# Patient Record
Sex: Female | Born: 1990 | Race: Black or African American | Hispanic: No | Marital: Single | Smoking: Never smoker
Health system: Southern US, Community
[De-identification: ages and names within clinical notes are randomized; demographics above are authoritative.]

## PROBLEM LIST (undated history)

## (undated) DIAGNOSIS — R05 Cough: Secondary | ICD-10-CM

## (undated) DIAGNOSIS — M25511 Pain in right shoulder: Secondary | ICD-10-CM

## (undated) DIAGNOSIS — E611 Iron deficiency: Secondary | ICD-10-CM

## (undated) DIAGNOSIS — I97190 Other postprocedural cardiac functional disturbances following cardiac surgery: Secondary | ICD-10-CM

## (undated) DIAGNOSIS — J111 Influenza due to unidentified influenza virus with other respiratory manifestations: Secondary | ICD-10-CM

## (undated) DIAGNOSIS — R7303 Prediabetes: Secondary | ICD-10-CM

## (undated) DIAGNOSIS — J159 Unspecified bacterial pneumonia: Secondary | ICD-10-CM

## (undated) DIAGNOSIS — R42 Dizziness and giddiness: Secondary | ICD-10-CM

## (undated) DIAGNOSIS — Z8739 Personal history of other diseases of the musculoskeletal system and connective tissue: Secondary | ICD-10-CM

## (undated) DIAGNOSIS — M25531 Pain in right wrist: Secondary | ICD-10-CM

## (undated) DIAGNOSIS — M545 Low back pain, unspecified: Secondary | ICD-10-CM

## (undated) DIAGNOSIS — N915 Oligomenorrhea, unspecified: Secondary | ICD-10-CM

## (undated) DIAGNOSIS — G5600 Carpal tunnel syndrome, unspecified upper limb: Secondary | ICD-10-CM

## (undated) DIAGNOSIS — R9431 Abnormal electrocardiogram [ECG] [EKG]: Secondary | ICD-10-CM

## (undated) DIAGNOSIS — R5383 Other fatigue: Secondary | ICD-10-CM

## (undated) DIAGNOSIS — E559 Vitamin D deficiency, unspecified: Secondary | ICD-10-CM

## (undated) DIAGNOSIS — M62830 Muscle spasm of back: Secondary | ICD-10-CM

## (undated) DIAGNOSIS — R002 Palpitations: Secondary | ICD-10-CM

## (undated) DIAGNOSIS — J22 Unspecified acute lower respiratory infection: Secondary | ICD-10-CM

## (undated) DIAGNOSIS — I442 Atrioventricular block, complete: Secondary | ICD-10-CM

## (undated) DIAGNOSIS — M129 Arthropathy, unspecified: Secondary | ICD-10-CM

## (undated) DIAGNOSIS — E7800 Pure hypercholesterolemia, unspecified: Secondary | ICD-10-CM

## (undated) DIAGNOSIS — R059 Cough, unspecified: Secondary | ICD-10-CM

## (undated) DIAGNOSIS — R110 Nausea: Secondary | ICD-10-CM

## (undated) DIAGNOSIS — H8110 Benign paroxysmal vertigo, unspecified ear: Secondary | ICD-10-CM

## (undated) DIAGNOSIS — R0602 Shortness of breath: Secondary | ICD-10-CM

## (undated) DIAGNOSIS — M546 Pain in thoracic spine: Secondary | ICD-10-CM

## (undated) DIAGNOSIS — M791 Myalgia, unspecified site: Secondary | ICD-10-CM

## (undated) DIAGNOSIS — N946 Dysmenorrhea, unspecified: Secondary | ICD-10-CM

## (undated) DIAGNOSIS — M25512 Pain in left shoulder: Secondary | ICD-10-CM

## (undated) HISTORY — DX: Abnormal electrocardiogram (ECG) (EKG): R94.31

## (undated) HISTORY — PX: ABLATION: SHX5711

## (undated) HISTORY — DX: Muscle spasm of back: M62.830

## (undated) HISTORY — DX: Pain in right shoulder: M25.511

## (undated) HISTORY — DX: Palpitations: R00.2

## (undated) HISTORY — DX: Oligomenorrhea, unspecified: N91.5

## (undated) HISTORY — DX: Carpal tunnel syndrome, unspecified upper limb: G56.00

## (undated) HISTORY — DX: Influenza due to unidentified influenza virus with other respiratory manifestations: J11.1

## (undated) HISTORY — DX: Nausea: R11.0

## (undated) HISTORY — DX: Prediabetes: R73.03

## (undated) HISTORY — DX: Arthropathy, unspecified: M12.9

## (undated) HISTORY — DX: Iron deficiency: E61.1

## (undated) HISTORY — DX: Cough, unspecified: R05.9

## (undated) HISTORY — DX: Shortness of breath: R06.02

## (undated) HISTORY — PX: LOOP RECORDER INSERTION: EP1214

## (undated) HISTORY — DX: Low back pain, unspecified: M54.50

## (undated) HISTORY — DX: Other fatigue: R53.83

## (undated) HISTORY — DX: Unspecified acute lower respiratory infection: J22

## (undated) HISTORY — DX: Pain in thoracic spine: M54.6

## (undated) HISTORY — DX: Hypomagnesemia: E83.42

## (undated) HISTORY — DX: Dizziness and giddiness: R42

## (undated) HISTORY — DX: Pure hypercholesterolemia, unspecified: E78.00

## (undated) HISTORY — DX: Pain in right wrist: M25.531

## (undated) HISTORY — DX: Myalgia, unspecified site: M79.10

## (undated) HISTORY — DX: Vitamin D deficiency, unspecified: E55.9

## (undated) HISTORY — DX: Dysmenorrhea, unspecified: N94.6

## (undated) HISTORY — DX: Pain in left shoulder: M25.512

## (undated) HISTORY — DX: Benign paroxysmal vertigo, unspecified ear: H81.10

## (undated) HISTORY — DX: Unspecified bacterial pneumonia: J15.9

---
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
## 2013-07-03 DIAGNOSIS — G90A Postural orthostatic tachycardia syndrome (POTS): Secondary | ICD-10-CM | POA: Insufficient documentation

## 2015-02-03 DIAGNOSIS — I471 Supraventricular tachycardia, unspecified: Secondary | ICD-10-CM | POA: Insufficient documentation

## 2017-01-20 DIAGNOSIS — M25512 Pain in left shoulder: Secondary | ICD-10-CM | POA: Insufficient documentation

## 2017-01-20 DIAGNOSIS — G8929 Other chronic pain: Secondary | ICD-10-CM | POA: Insufficient documentation

## 2017-09-12 DIAGNOSIS — M249 Joint derangement, unspecified: Secondary | ICD-10-CM | POA: Insufficient documentation

## 2017-09-12 DIAGNOSIS — Z95818 Presence of other cardiac implants and grafts: Secondary | ICD-10-CM | POA: Insufficient documentation

## 2017-10-27 DIAGNOSIS — I10 Essential (primary) hypertension: Secondary | ICD-10-CM | POA: Insufficient documentation

## 2017-10-29 DIAGNOSIS — Z8249 Family history of ischemic heart disease and other diseases of the circulatory system: Secondary | ICD-10-CM | POA: Insufficient documentation

## 2017-10-29 DIAGNOSIS — J4520 Mild intermittent asthma, uncomplicated: Secondary | ICD-10-CM | POA: Insufficient documentation

## 2017-10-29 DIAGNOSIS — Z832 Family history of diseases of the blood and blood-forming organs and certain disorders involving the immune mechanism: Secondary | ICD-10-CM | POA: Insufficient documentation

## 2018-03-27 ENCOUNTER — Emergency Department (HOSPITAL_COMMUNITY)
Admission: EM | Admit: 2018-03-27 | Discharge: 2018-03-27 | Disposition: A | Discharge status: Home / Self Care | Payer: Self-pay | Attending: Emergency Medicine | Admitting: Emergency Medicine

## 2018-03-27 ENCOUNTER — Encounter (HOSPITAL_COMMUNITY): Payer: Self-pay

## 2018-03-27 ENCOUNTER — Emergency Department (HOSPITAL_COMMUNITY): Payer: Self-pay

## 2018-03-27 DIAGNOSIS — R05 Cough: Secondary | ICD-10-CM | POA: Insufficient documentation

## 2018-03-27 DIAGNOSIS — R059 Cough, unspecified: Secondary | ICD-10-CM

## 2018-03-27 HISTORY — DX: Other postprocedural cardiac functional disturbances following cardiac surgery: I97.190

## 2018-03-27 HISTORY — DX: Atrioventricular block, complete: I44.2

## 2018-03-27 LAB — CBC WITH DIFFERENTIAL/PLATELET
Abs Immature Granulocytes: 0.02 10*3/uL (ref 0.00–0.07)
Basophils Absolute: 0 10*3/uL (ref 0.0–0.1)
Basophils Relative: 0 %
Eosinophils Absolute: 0 10*3/uL (ref 0.0–0.5)
Eosinophils Relative: 0 %
HCT: 43.4 % (ref 36.0–46.0)
Hemoglobin: 14.4 g/dL (ref 12.0–15.0)
Immature Granulocytes: 0 %
Lymphocytes Relative: 35 %
Lymphs Abs: 2.9 10*3/uL (ref 0.7–4.0)
MCH: 30.4 pg (ref 26.0–34.0)
MCHC: 33.2 g/dL (ref 30.0–36.0)
MCV: 91.8 fL (ref 80.0–100.0)
Monocytes Absolute: 0.7 10*3/uL (ref 0.1–1.0)
Monocytes Relative: 8 %
Neutro Abs: 4.7 10*3/uL (ref 1.7–7.7)
Neutrophils Relative %: 57 %
Platelets: 199 10*3/uL (ref 150–400)
RBC: 4.73 MIL/uL (ref 3.87–5.11)
RDW: 13.2 % (ref 11.5–15.5)
WBC: 8.4 10*3/uL (ref 4.0–10.5)
nRBC: 0 % (ref 0.0–0.2)

## 2018-03-27 LAB — D-DIMER, QUANTITATIVE (NOT AT ARMC): D DIMER QUANT: 1.05 ug{FEU}/mL — AB (ref 0.00–0.50)

## 2018-03-27 LAB — BASIC METABOLIC PANEL
Anion gap: 13 (ref 5–15)
BUN: 7 mg/dL (ref 6–20)
CO2: 24 mmol/L (ref 22–32)
CREATININE: 0.99 mg/dL (ref 0.44–1.00)
Calcium: 8.9 mg/dL (ref 8.9–10.3)
Chloride: 105 mmol/L (ref 98–111)
GFR calc Af Amer: >60 mL/min (ref >60)
GFR calc non Af Amer: >60 mL/min (ref >60)
GLUCOSE: 94 mg/dL (ref 70–99)
Potassium: 3 mmol/L — ABNORMAL LOW (ref 3.5–5.1)
Sodium: 142 mmol/L (ref 135–145)

## 2018-03-27 LAB — I-STAT TROPONIN, ED: Troponin i, poc: 0 ng/mL (ref 0.00–0.08)

## 2018-03-27 MED ORDER — IOPAMIDOL (ISOVUE-370) INJECTION 76%
INTRAVENOUS | Status: AC
  Filled 2018-03-27: qty 100

## 2018-03-27 MED ORDER — IOPAMIDOL (ISOVUE-370) INJECTION 76%
100.0000 mL | Freq: Once | INTRAVENOUS | Status: AC | PRN
  Administered 2018-03-27: 63 mL via INTRAVENOUS

## 2018-03-27 MED ORDER — KETOROLAC TROMETHAMINE 15 MG/ML IJ SOLN
15.0000 mg | Freq: Once | INTRAMUSCULAR | Status: AC
  Administered 2018-03-27: 15 mg via INTRAVENOUS
  Filled 2018-03-27: qty 1

## 2018-03-27 MED ORDER — POTASSIUM CHLORIDE CRYS ER 20 MEQ PO TBCR
40.0000 meq | EXTENDED_RELEASE_TABLET | Freq: Once | ORAL | Status: AC
  Administered 2018-03-27: 40 meq via ORAL
  Filled 2018-03-27: qty 2

## 2018-03-27 MED ORDER — SODIUM CHLORIDE 0.9 % IV BOLUS
1000.0000 mL | Freq: Once | INTRAVENOUS | Status: AC
  Administered 2018-03-27: 1000 mL via INTRAVENOUS

## 2018-03-27 NOTE — Discharge Instructions (Addendum)
For any problem.  Follow-up with your regular care provider as instructed.  Continue to use currently prescribed antibiotics.  Continue to use currently prescribed Tamiflu.

## 2018-03-27 NOTE — ED Provider Notes (Signed)
[facility] EMERGENCY [facility] Provider Note   CSN: [id number] Arrival date & time: 03/27/18  1137     History   Chief Complaint Chief Complaint  Patient presents with  . Cough    HPI Marilyn Rhodes is a 28 y.o. female.  28 year old female with prior medical history as detailed below presents for evaluation of cough, chest wall pain, and malaise and fatigue.  She reports that she was diagnosed with both the flu and a pneumonia last week.  She is currently on both Tamiflu and antibiotics for treatment.  She complains of continued chest wall discomfort.  She complains of continued cough.  She denies shortness of breath.  She denies recent fever.  She denies nausea, vomiting, abdominal pain, or other acute complaint.  The history is provided by the patient and medical records.  Cough  Cough characteristics:  Dry Sputum characteristics:  Nondescript Severity:  Mild Onset quality:  Gradual Timing:  Constant Progression:  Waxing and waning Chronicity:  New Worsened by:  Nothing Ineffective treatments:  Cough suppressants Associated symptoms: chest pain     Past Medical History:  Diagnosis Date  . 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   History reviewed. No pertinent surgical history.   OB History   No obstetric history on file.      Home Medications    Prior to Admission medications   Not on File    Family History No family history on file.  Social History Social History   Tobacco Use  . Smoking status: Never Smoker  . Smokeless tobacco: Never Used  Substance Use Topics  . Alcohol use: Not Currently  . Drug use: Not Currently     Allergies   Albuterol and Penicillins   Review of Systems Review of Systems  Respiratory: Positive for cough.   Cardiovascular: Positive for chest pain.  All other systems reviewed and are  negative.    Physical Exam Updated Vital Signs BP 123/88   Pulse 78   Temp 98.6 F (37 C) (Oral)   Resp 16   LMP 03/11/2018 (Exact Date)   SpO2 100%   Physical Exam Vitals signs and nursing note reviewed.  Constitutional:      General: She is not in acute distress.    Appearance: She is well-developed.  HENT:     Head: Normocephalic and atraumatic.  Eyes:     Conjunctiva/sclera: Conjunctivae normal.     Pupils: Pupils are equal, round, and reactive to light.  Neck:     Musculoskeletal: Normal range of motion and neck supple.  Cardiovascular:     Rate and Rhythm: Normal rate and regular rhythm.     Heart sounds: Normal heart sounds.  Pulmonary:     Effort: Pulmonary effort is normal. No respiratory distress.     Breath sounds: Normal breath sounds.  Abdominal:     General: There is no distension.     Palpations: Abdomen is soft.     Tenderness: There is no abdominal tenderness.  Musculoskeletal: Normal range of motion.        General: No deformity.  Skin:    General: Skin is warm and dry.  Neurological:     Mental Status: She is alert and oriented to person, place, and time.      ED Treatments / Results  Labs (all labs ordered are listed, but only abnormal results  are displayed) Labs Reviewed  D-DIMER, QUANTITATIVE (NOT AT [facility]) - Abnormal; Notable for the following components:      Result Value   D-Dimer, Quant 1.05 (*)    All other components within normal limits  BASIC METABOLIC PANEL - Abnormal; Notable for the following components:   Potassium 3.0 (*)    All other components within normal limits  CBC WITH DIFFERENTIAL/PLATELET  I-STAT TROPONIN, ED    EKG EKG Interpretation  Date/Time:  Tuesday March 27 2018 11:40:28 EST Ventricular Rate:  101 PR Interval:  120 QRS Duration: 86 QT Interval:  348 QTC Calculation: 451 R Axis:   104 Text Interpretation:  Sinus tachycardia with Premature atrial complexes Biatrial enlargement Rightward axis  Pulmonary disease pattern T wave abnormality, consider inferior ischemia T wave abnormality, consider anterolateral ischemia Abnormal ECG Confirmed by Kristine Royal [phone]) on 03/27/2018 1:13:18 PM   Radiology Dg Chest 2 View  Result Date: 03/27/2018 CLINICAL DATA:  Cough, weakness EXAM: CHEST - 2 VIEW COMPARISON:  None. FINDINGS: The heart size and mediastinal contours are within normal limits. Implantable loop recorder both lungs are clear. The visualized skeletal structures are unremarkable. IMPRESSION: No acute abnormality of the lungs.  No focal airspace opacity. Electronically Signed   By: Lauralyn Primes M.D.   On: 03/27/2018 14:00   Ct Angio Chest Pe W And/or Wo Contrast  Result Date: 03/27/2018 CLINICAL DATA:  Cough, pneumonia, fluid, chest wall pain, rule out pulmonary embolism EXAM: CT ANGIOGRAPHY CHEST WITH CONTRAST TECHNIQUE: Multidetector CT imaging of the chest was performed using the standard protocol during bolus administration of intravenous contrast. Multiplanar CT image reconstructions and MIPs were obtained to evaluate the vascular anatomy. CONTRAST:  37mL ISOVUE-370 IOPAMIDOL (ISOVUE-370) INJECTION 76% COMPARISON:  None. FINDINGS: Cardiovascular: Satisfactory opacification of the pulmonary arteries to the segmental level. No evidence of pulmonary embolism. Normal heart size. No pericardial effusion. Mediastinum/Nodes: No enlarged mediastinal, hilar, or axillary lymph nodes. Thyroid gland, trachea, and esophagus demonstrate no significant findings. Lungs/Pleura: There are bilateral scattered small ground-glass opacities, most prominent in the right upper lobe with a more conspicuous and focal opacity of the medial right pulmonary apex (series 6, image 35). No pleural effusion or pneumothorax. Upper Abdomen: No acute abnormality. Musculoskeletal: Implantable loop recorder. No acute or significant osseous findings. Review of the MIP images confirms the above findings. IMPRESSION: 1.   Negative examination pulmonary embolism. 2. There are bilateral scattered small ground-glass opacities, most prominent in the right upper lobe with a more conspicuous and focal opacity of the medial right pulmonary apex (series 6, image 35). Findings are generally consistent with infection. Electronically Signed   By: Lauralyn Primes M.D.   On: 03/27/2018 16:35    Procedures Procedures (including critical care time)  Medications Ordered in ED Medications  iopamidol (ISOVUE-370) 76 % injection (has no administration in time range)  sodium chloride 0.9 % bolus 1,000 mL (0 mLs Intravenous Stopped 03/27/18 1624)  ketorolac (TORADOL) 15 MG/ML injection 15 mg (15 mg Intravenous Given 03/27/18 1342)  potassium chloride SA (K-DUR,KLOR-CON) CR tablet 40 mEq (40 mEq Oral Given 03/27/18 1637)  iopamidol (ISOVUE-370) 76 % injection 100 mL (63 mLs Intravenous Contrast Given 03/27/18 1624)     Initial Impression / Assessment and Plan / ED Course  I have reviewed the triage vital signs and the nursing notes.  Pertinent labs & imaging results that were available during my care of the patient were reviewed by me and considered in my medical decision making (see chart  for details).     MDM  Screen complete  Patient is presenting for evaluation of cough, chest wall pain, malaise, and fatigue.  Patient is currently finishing a course of antibiotics for recently diagnosed pneumonia.  Work-up today does demonstrate evidence of likely resolving pneumonia.  No evidence of PE or ACS found on work-up.  EKG is without evidence of acute ischemia.  Troponin is negative.  CTA did not demonstrate PE.  Other screening labs are without significant abnormality.  Patient does feel improved following treatment in the ED.  She understands need for close follow-up.  Strict return precautions given and understood.  Final Clinical Impressions(s) / ED Diagnoses   Final diagnoses:  Cough    ED Discharge Orders    None        Wynetta Fines, MD 03/27/18 [phone]

## 2018-03-27 NOTE — ED Triage Notes (Signed)
Pt presents for evaluation of cough with diagnosis of pneumonia last week. Non productive cough. Chest wall pain with coughing. Pt is on antibiotics.

## 2018-06-26 DIAGNOSIS — M79641 Pain in right hand: Secondary | ICD-10-CM | POA: Insufficient documentation

## 2018-06-27 DIAGNOSIS — R202 Paresthesia of skin: Secondary | ICD-10-CM | POA: Insufficient documentation

## 2018-07-17 ENCOUNTER — Telehealth: Payer: Self-pay | Admitting: *Deleted

## 2018-07-17 NOTE — Telephone Encounter (Signed)
REFERRAL SENT TO SCHEDULING AND NOTES ON FILE FROM [facility] [phone].

## 2018-08-03 ENCOUNTER — Telehealth: Payer: Self-pay

## 2018-08-03 NOTE — Telephone Encounter (Signed)
Called pt regarding appt on 08/06/18. Unable to leave.

## 2018-08-06 ENCOUNTER — Other Ambulatory Visit: Payer: Self-pay

## 2018-08-06 ENCOUNTER — Telehealth (INDEPENDENT_AMBULATORY_CARE_PROVIDER_SITE_OTHER): Payer: Self-pay | Admitting: Internal Medicine

## 2018-08-06 DIAGNOSIS — R55 Syncope and collapse: Secondary | ICD-10-CM

## 2018-08-06 NOTE — Progress Notes (Signed)
Attempted to contact patient for virtual visit by phone. Only phone number listed in epic was not functional.  Will need to reschedule once an appropriate phone number can be found or in office.

## 2018-08-29 ENCOUNTER — Telehealth: Payer: Self-pay | Admitting: Internal Medicine

## 2018-08-29 NOTE — Telephone Encounter (Signed)
New Message ° ° ° °Left message to confirm appt and get consent  °

## 2018-08-30 ENCOUNTER — Telehealth: Payer: Self-pay | Admitting: Internal Medicine

## 2018-08-30 ENCOUNTER — Other Ambulatory Visit: Payer: Self-pay

## 2018-09-01 DIAGNOSIS — M7542 Impingement syndrome of left shoulder: Secondary | ICD-10-CM | POA: Insufficient documentation

## 2018-11-08 ENCOUNTER — Encounter: Payer: Self-pay | Admitting: Internal Medicine

## 2018-11-08 ENCOUNTER — Other Ambulatory Visit: Payer: Self-pay

## 2018-11-08 ENCOUNTER — Telehealth (INDEPENDENT_AMBULATORY_CARE_PROVIDER_SITE_OTHER): Payer: BC Managed Care – PPO | Admitting: Internal Medicine

## 2018-11-08 VITALS — Ht 68.0 in | Wt 203.0 lb

## 2018-11-08 DIAGNOSIS — R55 Syncope and collapse: Secondary | ICD-10-CM

## 2018-11-08 NOTE — Progress Notes (Signed)
Electrophysiology TeleHealth Note   Due to national recommendations of social distancing due to COVID 19, an audio/video telehealth visit is felt to be most appropriate for this patient at this time.  See MyChart message from today for the patient's consent to telehealth for [facility].   Date:  11/08/2018   ID:  Marilyn Rhodes, DOB December 23, 1990, MRN [id number]  Location: patient's home  Provider location: [address], [place]  Evaluation Performed: Follow-up visit  PCP:  No primary care provider on file.  Cardiologist:   Olin Pia [facility] Electrophysiologist:  same  Chief Complaint: loop recorder at end of service  History of Present Illness:    Marilyn Rhodes is a 28 y.o. female who presents via audio/video conferencing for a telehealth visit today.  She is followed at [facility] for ? EDS and dysautonomia and POTS .  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     Past Medical History:  Diagnosis Date  . Arthropathy   . AV block, complete, post op complication of AV nodal ablation ([place])   . Benign positional vertigo   . Bilateral shoulder pain   . Bilateral thoracic back pain   . Carpal tunnel syndrome   . Cough   . Dizziness   . Dysmenorrhea   . ECG abnormal   . Fatigue   . Flu   . Hypercholesteremia   . Hypercholesterolemia   . Hypomagnesemia   . Iron deficiency   . Lower respiratory tract infection   . Lumbar back pain   . Muscle spasm of back   . Myalgia   .  Nausea   . Oligomenorrhea   . Palpitations   . Pneumonia, bacterial   . Pre-diabetes   . Right wrist pain   . SOB (shortness of breath) on exertion   . Vitamin D deficiency     Past Surgical History:  Procedure Laterality Date  . ABLATION    . PACEMAKER INSERTION      Current Outpatient Medications  Medication Sig Dispense Refill  . ALPRAZolam (XANAX) 0.25 MG tablet Take 0.25 mg by mouth at bedtime as needed for anxiety.    Marland Kitchen atenolol (TENORMIN) 25 MG tablet Take 25 mg by mouth daily.    Marland Kitchen etodolac (LODINE XL) 400 MG 24 hr tablet Take 400 mg by mouth daily as needed.     . Fluticasone Furoate (ARNUITY ELLIPTA) 100 MCG/ACT AEPB Inhale 100 mcg into the lungs daily.    . medroxyPROGESTERone (DEPO-PROVERA) 150 MG/ML injection Inject 150 mg into the muscle every 3 (three) months.    . midodrine (PROAMATINE) 2.5 MG tablet Take 2.5 mg by mouth 3 (three) times daily with meals.    . Vitamin D, Ergocalciferol, (DRISDOL) 1.25 MG (50000 UT) CAPS capsule Take 50,000 Units by mouth every 7 (seven) days.    . busPIRone (BUSPAR) 5 MG tablet Take 5 mg by mouth 3 (three) times daily.    . Ferrous Sulfate (  IRON) 142 (45 Fe) MG TBCR Take by mouth daily.    . sertraline (ZOLOFT) 50 MG tablet Take 50 mg by mouth daily.     No current facility-administered medications for this visit.     Allergies:   Albuterol and Penicillins   Social History:  The patient  reports that she has never smoked. She has never used smokeless tobacco. She reports previous alcohol use. She reports previous drug use.   Family History:  The patient's   family history includes Endometriosis in her mother; Factor VIII deficiency in her father.   ROS:  Please see the history of present illness.   All other systems are personally reviewed and negative.    Exam:    Vital Signs:  Ht 5\' 8"  (1.727 m)   Wt 203 lb (92.1 kg)   BMI 30.87 kg/m     Well appearing, alert and conversant, regular work of breathing,  good skin color  Eyes- anicteric, neuro- grossly intact, skin- no apparent rash or lesions or cyanosis, mouth- oral mucosa is pink   Labs/Other Tests and Data Reviewed:    Recent Labs: 03/27/2018: BUN 7; Creatinine, Ser 0.99; Hemoglobin 14.4; Platelets 199; Potassium 3.0; Sodium 142   Wt Readings from Last 3 Encounters:  11/08/18 203 lb (92.1 kg)     Other studies personally reviewed: Additional studies/ records that were reviewed today include:  Notes from [facility] :     ASSESSMENT & PLAN:     Dysautonomia /POTS   Linq monitor at [facility]  As above, the pt is requesting change out of her LINQ monitor.  I have reached out to Dr 11/10/18 to clarify with her the request   We are glad to help n whatever we can  COVID 19 screen The patient denies symptoms of COVID 19 at this time.  The importance of social distancing was discussed today.  Follow-up:  To be determined    Current medicines are reviewed at length with the patient today.   The patient does not have concerns regarding her medicines.  The following changes were made today:  none  Labs/ tests ordered today include:   No orders of the defined types were placed in this encounter.   Future tests ( post COVID )     Patient Risk:  after full review of this patients clinical status, I feel that they are at moderate*  risk at this time.  Today, I have spent 16 minutes with the patient with telehealth technology discussing the above.  Signed, Bascom Levels, MD  11/08/2018 4:23 PM     [facility] [address] [place] [phone] (office) [phone] (fax)

## 2019-07-29 DIAGNOSIS — K580 Irritable bowel syndrome with diarrhea: Secondary | ICD-10-CM | POA: Insufficient documentation

## 2020-06-15 ENCOUNTER — Encounter (HOSPITAL_BASED_OUTPATIENT_CLINIC_OR_DEPARTMENT_OTHER): Payer: Self-pay

## 2020-06-15 ENCOUNTER — Other Ambulatory Visit: Payer: Self-pay

## 2020-06-15 ENCOUNTER — Emergency Department (HOSPITAL_BASED_OUTPATIENT_CLINIC_OR_DEPARTMENT_OTHER)
Admission: EM | Admit: 2020-06-15 | Discharge: 2020-06-15 | Disposition: A | Discharge status: Home / Self Care | Payer: Self-pay | Attending: Emergency Medicine | Admitting: Emergency Medicine

## 2020-06-15 ENCOUNTER — Emergency Department (HOSPITAL_BASED_OUTPATIENT_CLINIC_OR_DEPARTMENT_OTHER): Payer: Self-pay | Admitting: Radiology

## 2020-06-15 DIAGNOSIS — M546 Pain in thoracic spine: Secondary | ICD-10-CM | POA: Insufficient documentation

## 2020-06-15 HISTORY — DX: Personal history of other diseases of the musculoskeletal system and connective tissue: Z87.39

## 2020-06-15 LAB — CBC WITH DIFFERENTIAL/PLATELET
Abs Immature Granulocytes: 0.02 10*3/uL (ref 0.00–0.07)
Basophils Absolute: 0 10*3/uL (ref 0.0–0.1)
Basophils Relative: 0 %
Eosinophils Absolute: 0.2 10*3/uL (ref 0.0–0.5)
Eosinophils Relative: 2 %
HCT: 40.9 % (ref 36.0–46.0)
Hemoglobin: 13.4 g/dL (ref 12.0–15.0)
Immature Granulocytes: 0 %
Lymphocytes Relative: 37 %
Lymphs Abs: 3.4 10*3/uL (ref 0.7–4.0)
MCH: 30.9 pg (ref 26.0–34.0)
MCHC: 32.8 g/dL (ref 30.0–36.0)
MCV: 94.5 fL (ref 80.0–100.0)
Monocytes Absolute: 0.6 10*3/uL (ref 0.1–1.0)
Monocytes Relative: 7 %
Neutro Abs: 5 10*3/uL (ref 1.7–7.7)
Neutrophils Relative %: 54 %
Platelets: 202 10*3/uL (ref 150–400)
RBC: 4.33 MIL/uL (ref 3.87–5.11)
RDW: 13.4 % (ref 11.5–15.5)
WBC: 9.2 10*3/uL (ref 4.0–10.5)
nRBC: 0 % (ref 0.0–0.2)

## 2020-06-15 LAB — BASIC METABOLIC PANEL
Anion gap: 8 (ref 5–15)
BUN: 6 mg/dL (ref 6–20)
CO2: 25 mmol/L (ref 22–32)
Calcium: 8.9 mg/dL (ref 8.9–10.3)
Chloride: 106 mmol/L (ref 98–111)
Creatinine, Ser: 0.89 mg/dL (ref 0.44–1.00)
GFR, Estimated: >60 mL/min (ref >60)
Glucose, Bld: 90 mg/dL (ref 70–99)
Potassium: 3.6 mmol/L (ref 3.5–5.1)
Sodium: 139 mmol/L (ref 135–145)

## 2020-06-15 LAB — URINALYSIS, ROUTINE W REFLEX MICROSCOPIC
Glucose, UA: NEGATIVE mg/dL
Hgb urine dipstick: NEGATIVE
Ketones, ur: NEGATIVE mg/dL
Nitrite: NEGATIVE
Specific Gravity, Urine: 1.019 (ref 1.005–1.030)
pH: 6.5 (ref 5.0–8.0)

## 2020-06-15 LAB — D-DIMER, QUANTITATIVE: D-Dimer, Quant: 0.81 ug/mL-FEU — ABNORMAL HIGH (ref 0.00–0.50)

## 2020-06-15 LAB — PREGNANCY, URINE: Preg Test, Ur: NEGATIVE

## 2020-06-15 MED ORDER — DEXAMETHASONE SODIUM PHOSPHATE 10 MG/ML IJ SOLN
10.0000 mg | Freq: Once | INTRAMUSCULAR | Status: AC
  Administered 2020-06-15: 10 mg via INTRAVENOUS

## 2020-06-15 MED ORDER — HYDROCODONE-ACETAMINOPHEN 5-325 MG PO TABS: 1.0000 | ORAL_TABLET | ORAL | 0 refills | Status: DC | PRN

## 2020-06-15 MED ORDER — KETOROLAC TROMETHAMINE 30 MG/ML IJ SOLN
30.0000 mg | Freq: Once | INTRAMUSCULAR | Status: AC
  Administered 2020-06-15: 30 mg via INTRAVENOUS

## 2020-06-15 MED ORDER — METHOCARBAMOL 500 MG PO TABS: 500.0000 mg | ORAL_TABLET | Freq: Two times a day (BID) | ORAL | 0 refills | Status: DC

## 2020-06-15 MED ORDER — DEXAMETHASONE SODIUM PHOSPHATE 10 MG/ML IJ SOLN
10.0000 mg | Freq: Once | INTRAMUSCULAR | Status: DC
  Filled 2020-06-15: qty 1

## 2020-06-15 MED ORDER — KETOROLAC TROMETHAMINE 30 MG/ML IJ SOLN
30.0000 mg | Freq: Once | INTRAMUSCULAR | Status: DC
  Filled 2020-06-15: qty 1

## 2020-06-15 NOTE — ED Provider Notes (Signed)
[facility] EMERGENCY DEPT Provider Note   CSN: [id number] Arrival date & time: 06/15/20  1729     History Chief Complaint  Patient presents with  . Back Pain    Marilyn Rhodes is a 30 y.o. female.  Pt presents to the ED today with back pain.  She said it hurst with moving and with breathing.  Pt has a hx of chronic back pain.  Triage nurse said discitis, but it is DJD.  Pt took her prescribed etodolac, but that did not help.  Pt denies any pain in her arms or legs.  No numbness.  She is able to ambulate.  She drove here.        Past Medical History:  Diagnosis Date  . Arthropathy   . 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 Benign positional vertigo   . Bilateral shoulder pain   . Bilateral thoracic back pain   . Carpal tunnel syndrome   . Cough   . Dizziness   . Dysmenorrhea   . ECG abnormal   . Fatigue   . Flu   . History of degenerative disc disease   . Hypercholesteremia   . Hypomagnesemia   . Iron deficiency   . Lower respiratory tract infection   . Lumbar back pain   . Muscle spasm of back   . Myalgia   . Nausea   . Oligomenorrhea   . Palpitations   . Pneumonia, bacterial   . Pre-diabetes   . Right wrist pain   . SOB (shortness of breath) on exertion   . Vitamin D deficiency     There are no problems to display for this patient.   Past Surgical History:  Procedure Laterality Date  . ABLATION    . LOOP RECORDER INSERTION       OB History   No obstetric history on file.     Family History  Problem Relation Age of Onset  . Endometriosis Mother   . Factor VIII deficiency Father     Social History   Tobacco Use  . Smoking status: Never Smoker  . Smokeless tobacco: Never Used  Vaping Use  . Vaping Use: Never used  Substance Use Topics  . Alcohol use: Not Currently  . Drug use: Not Currently    Home Medications Prior to Admission medications   Medication Sig Start Date End Date Taking?  Authorizing Provider  HYDROcodone-acetaminophen (NORCO/VICODIN) 5-325 MG tablet Take 1 tablet by mouth every 4 (four) hours as needed. 06/15/20  Yes Jacalyn Lefevre, MD  methocarbamol (ROBAXIN) 500 MG tablet Take 1 tablet (500 mg total) by mouth 2 (two) times daily. 06/15/20  Yes Jacalyn Lefevre, MD  ALPRAZolam Prudy Feeler) 0.25 MG tablet Take 0.25 mg by mouth at bedtime as needed for anxiety.    [provider]  atenolol (TENORMIN) 25 MG tablet Take 25 mg by mouth daily.    [provider]  busPIRone (BUSPAR) 5 MG tablet Take 5 mg by mouth 3 (three) times daily.    [provider]  etodolac (LODINE XL) 400 MG 24 hr tablet Take 400 mg by mouth daily as needed.     [provider]  Ferrous Sulfate (IRON) 142 (45 Fe) MG TBCR Take by mouth daily.    [provider]  Fluticasone Furoate (ARNUITY ELLIPTA) 100 MCG/ACT AEPB Inhale 100 mcg into the lungs daily.    [provider]  medroxyPROGESTERone (DEPO-PROVERA) 150 MG/ML injection Inject 150  mg into the muscle every 3 (three) months.    [provider]  midodrine (PROAMATINE) 2.5 MG tablet Take 2.5 mg by mouth 3 (three) times daily with meals.    [provider]  sertraline (ZOLOFT) 50 MG tablet Take 50 mg by mouth daily.    [provider]  Vitamin D, Ergocalciferol, (DRISDOL) 1.25 MG (50000 UT) CAPS capsule Take 50,000 Units by mouth every 7 (seven) days.    [provider]    Allergies    Albuterol and Penicillins  Review of Systems   Review of Systems  Musculoskeletal: Positive for back pain.  All other systems reviewed and are negative.   Physical Exam Updated Vital Signs BP 122/80 (BP Location: Right Arm)   Pulse 78   Temp 98.7 F (37.1 C) (Oral)   Resp (!) 24   Ht 5\' 8"  (1.727 m)   Wt 105.3 kg   SpO2 99%   BMI 35.31 kg/m   Physical Exam Vitals and nursing note reviewed.  Constitutional:      Appearance: Normal appearance.  HENT:     Head:  Normocephalic and atraumatic.     Right Ear: External ear normal.     Left Ear: External ear normal.     Nose: Nose normal.     Mouth/Throat:     Mouth: Mucous membranes are moist.     Pharynx: Oropharynx is clear.  Eyes:     Extraocular Movements: Extraocular movements intact.     Conjunctiva/sclera: Conjunctivae normal.     Pupils: Pupils are equal, round, and reactive to light.  Cardiovascular:     Rate and Rhythm: Normal rate and regular rhythm.     Pulses: Normal pulses.     Heart sounds: Normal heart sounds.  Pulmonary:     Effort: Pulmonary effort is normal.     Breath sounds: Normal breath sounds.  Abdominal:     General: Abdomen is flat. Bowel sounds are normal.     Palpations: Abdomen is soft.  Musculoskeletal:     Cervical back: Normal range of motion and neck supple.       Back:  Skin:    General: Skin is warm.     Capillary Refill: Capillary refill takes less than 2 seconds.  Neurological:     General: No focal deficit present.     Mental Status: She is alert and oriented to person, place, and time.  Psychiatric:        Mood and Affect: Mood normal.        Behavior: Behavior normal.        Thought Content: Thought content normal.        Judgment: Judgment normal.     ED Results / Procedures / Treatments   Labs (all labs ordered are listed, but only abnormal results are displayed) Labs Reviewed  URINALYSIS, ROUTINE W REFLEX MICROSCOPIC - Abnormal; Notable for the following components:      Result Value   APPearance HAZY (*)    Bilirubin Urine SMALL (*)    Protein, ur TRACE (*)    Leukocytes,Ua TRACE (*)    Bacteria, UA FEW (*)    All other components within normal limits  D-DIMER, QUANTITATIVE - Abnormal; Notable for the following components:   D-Dimer, Quant 0.81 (*)    All other components within normal limits  PREGNANCY, URINE  BASIC METABOLIC PANEL  CBC WITH DIFFERENTIAL/PLATELET    EKG None  Radiology DG Chest 2 View  Result Date:  06/15/2020 CLINICAL DATA:  Chest pain EXAM: CHEST - 2 VIEW COMPARISON:  03/27/2018 FINDINGS: Electronic recording device over the chest. No focal opacity or pleural effusion. Stable cardiomediastinal silhouette. No pneumothorax. Scoliosis of the spine. IMPRESSION: No active cardiopulmonary disease. Electronically Signed   By: Jasmine Pang M.D.   On: 06/15/2020 18:38    Procedures Procedures   Medications Ordered in ED Medications  ketorolac (TORADOL) 30 MG/ML injection 30 mg (30 mg Intravenous Given 06/15/20 1822)  dexamethasone (DECADRON) injection 10 mg (10 mg Intravenous Given 06/15/20 1820)    ED Course  I have reviewed the triage vital signs and the nursing notes.  Pertinent labs & imaging results that were available during my care of the patient were reviewed by me and considered in my medical decision making (see chart for details).    MDM Rules/Calculators/A&P                          Pt is feeling better.  Her d-dimer is slightly elevated which it has been in the past.  Pt said it is always high.  She is offered CT to check for PE, but she does not think this is what she has and I don't either.  She knows to return if she develops sob.  She is stable for d/c.  Return if worse. Final Clinical Impression(s) / ED Diagnoses Final diagnoses:  Acute bilateral thoracic back pain    Rx / DC Orders ED Discharge Orders         Ordered    methocarbamol (ROBAXIN) 500 MG tablet  2 times daily        06/15/20 1945    HYDROcodone-acetaminophen (NORCO/VICODIN) 5-325 MG tablet  Every 4 hours PRN        06/15/20 1945           Jacalyn Lefevre, MD 06/15/20 1946

## 2020-06-15 NOTE — ED Triage Notes (Signed)
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 Pt presents with acute on chronic upper back pain that is unrelieved with her Rx medication. Pt took etodolac this AM without relief. Pt had sudden onset of the pain on 4/30.

## 2020-06-20 ENCOUNTER — Emergency Department (HOSPITAL_BASED_OUTPATIENT_CLINIC_OR_DEPARTMENT_OTHER)
Admission: EM | Admit: 2020-06-20 | Discharge: 2020-06-20 | Disposition: A | Discharge status: Home / Self Care | Payer: Self-pay | Attending: Emergency Medicine | Admitting: Emergency Medicine

## 2020-06-20 ENCOUNTER — Emergency Department (HOSPITAL_BASED_OUTPATIENT_CLINIC_OR_DEPARTMENT_OTHER): Payer: Self-pay

## 2020-06-20 ENCOUNTER — Other Ambulatory Visit: Payer: Self-pay

## 2020-06-20 ENCOUNTER — Encounter (HOSPITAL_BASED_OUTPATIENT_CLINIC_OR_DEPARTMENT_OTHER): Payer: Self-pay

## 2020-06-20 DIAGNOSIS — S39012A Strain of muscle, fascia and tendon of lower back, initial encounter: Secondary | ICD-10-CM

## 2020-06-20 DIAGNOSIS — M545 Low back pain, unspecified: Secondary | ICD-10-CM | POA: Insufficient documentation

## 2020-06-20 DIAGNOSIS — Y92410 Unspecified street and highway as the place of occurrence of the external cause: Secondary | ICD-10-CM | POA: Insufficient documentation

## 2020-06-20 LAB — PREGNANCY, URINE: Preg Test, Ur: NEGATIVE

## 2020-06-20 MED ORDER — METHOCARBAMOL 500 MG PO TABS: 500.0000 mg | ORAL_TABLET | Freq: Two times a day (BID) | ORAL | 0 refills | Status: DC
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
## 2020-07-25 ENCOUNTER — Emergency Department (HOSPITAL_BASED_OUTPATIENT_CLINIC_OR_DEPARTMENT_OTHER)
Admission: EM | Admit: 2020-07-25 | Discharge: 2020-07-25 | Disposition: A | Discharge status: Home / Self Care | Payer: Self-pay | Attending: Emergency Medicine | Admitting: Emergency Medicine

## 2020-07-25 ENCOUNTER — Other Ambulatory Visit: Payer: Self-pay

## 2020-07-25 DIAGNOSIS — M545 Low back pain, unspecified: Secondary | ICD-10-CM | POA: Insufficient documentation

## 2020-07-25 MED ORDER — METHOCARBAMOL 500 MG PO TABS: 500.0000 mg | ORAL_TABLET | Freq: Two times a day (BID) | ORAL | 0 refills | Status: AC

## 2020-07-25 MED ORDER — KETOROLAC TROMETHAMINE 30 MG/ML IJ SOLN
30.0000 mg | Freq: Once | INTRAMUSCULAR | Status: AC
  Administered 2020-07-25: 30 mg via INTRAMUSCULAR
  Filled 2020-07-25: qty 1

## 2020-07-25 MED ORDER — METHOCARBAMOL 1000 MG/10ML IJ SOLN
1000.0000 mg | Freq: Once | INTRAMUSCULAR | Status: AC
  Administered 2020-07-25: 1000 mg via INTRAMUSCULAR
  Filled 2020-07-25: qty 10

## 2020-07-25 NOTE — ED Notes (Signed)
Pt stated that she is feeling much better

## 2020-07-25 NOTE — ED Provider Notes (Signed)
Patient at this time states that she is feeling better.  Discharge medications and instructions are completed by Dr. Pilar Plate.  Will discharge patient.   Vanetta Mulders, MD 07/25/20 [phone]

## 2020-07-25 NOTE — ED Notes (Signed)
Visually checked on pt. Normal breathing pattern

## 2020-07-25 NOTE — Discharge Instructions (Addendum)
You were evaluated in the Emergency Department and after careful evaluation, we did not find any emergent condition requiring admission or further testing in the hospital.  Your exam/testing today was overall reassuring.  Recommend follow-up with a primary care doctor to further discuss your symptoms.  Recommend Tylenol or Motrin at home for discomfort.  You can use the Robaxin muscle relaxer prescribed as needed for more significant pain.  Please return to the Emergency Department if you experience any worsening of your condition.  Thank you for allowing [place] to be a part of your care.
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
## 2020-09-05 ENCOUNTER — Encounter (HOSPITAL_COMMUNITY): Payer: Self-pay | Admitting: Emergency Medicine

## 2020-09-05 ENCOUNTER — Emergency Department (HOSPITAL_COMMUNITY)
Admission: EM | Admit: 2020-09-05 | Discharge: 2020-09-05 | Disposition: A | Discharge status: Home / Self Care | Payer: Self-pay | Attending: Emergency Medicine | Admitting: Emergency Medicine

## 2020-09-05 ENCOUNTER — Other Ambulatory Visit: Payer: Self-pay

## 2020-09-05 DIAGNOSIS — Z5321 Procedure and treatment not carried out due to patient leaving prior to being seen by health care provider: Secondary | ICD-10-CM | POA: Insufficient documentation

## 2020-09-05 DIAGNOSIS — R112 Nausea with vomiting, unspecified: Secondary | ICD-10-CM | POA: Insufficient documentation

## 2020-09-05 DIAGNOSIS — R197 Diarrhea, unspecified: Secondary | ICD-10-CM | POA: Insufficient documentation

## 2020-09-05 LAB — CBC
HCT: 44.2 % (ref 36.0–46.0)
Hemoglobin: 14.7 g/dL (ref 12.0–15.0)
MCH: 31.5 pg (ref 26.0–34.0)
MCHC: 33.3 g/dL (ref 30.0–36.0)
MCV: 94.8 fL (ref 80.0–100.0)
Platelets: 226 10*3/uL (ref 150–400)
RBC: 4.66 MIL/uL (ref 3.87–5.11)
RDW: 14.6 % (ref 11.5–15.5)
WBC: 10.2 10*3/uL (ref 4.0–10.5)
nRBC: 0 % (ref 0.0–0.2)

## 2020-09-05 LAB — COMPREHENSIVE METABOLIC PANEL
ALT: 17 U/L (ref 0–44)
AST: 19 U/L (ref 15–41)
Albumin: 4.5 g/dL (ref 3.5–5.0)
Alkaline Phosphatase: 52 U/L (ref 38–126)
Anion gap: 13 (ref 5–15)
BUN: 8 mg/dL (ref 6–20)
CO2: 24 mmol/L (ref 22–32)
Calcium: 9.9 mg/dL (ref 8.9–10.3)
Chloride: 102 mmol/L (ref 98–111)
Creatinine, Ser: 0.72 mg/dL (ref 0.44–1.00)
GFR, Estimated: >60 mL/min (ref >60)
Glucose, Bld: 104 mg/dL — ABNORMAL HIGH (ref 70–99)
Potassium: 3.6 mmol/L (ref 3.5–5.1)
Sodium: 139 mmol/L (ref 135–145)
Total Bilirubin: 0.7 mg/dL (ref 0.3–1.2)
Total Protein: 7.8 g/dL (ref 6.5–8.1)

## 2020-09-05 LAB — URINALYSIS, ROUTINE W REFLEX MICROSCOPIC
Bilirubin Urine: NEGATIVE
Glucose, UA: NEGATIVE mg/dL
Hgb urine dipstick: NEGATIVE
Ketones, ur: >80 mg/dL — AB
Leukocytes,Ua: NEGATIVE
Nitrite: NEGATIVE
Protein, ur: 30 mg/dL — AB
Specific Gravity, Urine: 1.02 (ref 1.005–1.030)
pH: 7.5 (ref 5.0–8.0)

## 2020-09-05 LAB — I-STAT BETA HCG BLOOD, ED (MC, WL, AP ONLY): I-stat hCG, quantitative: <5 m[IU]/mL (ref <5)

## 2020-09-05 LAB — LIPASE, BLOOD: Lipase: 23 U/L (ref 11–51)

## 2020-09-05 NOTE — ED Triage Notes (Signed)
BIB EMS, complains of N/V/D since around 10-11am, denies abdominal pain. EMS gave 4 mg Zofran.

## 2020-09-05 NOTE — ED Provider Notes (Signed)
Emergency Medicine Provider Triage Evaluation Note  Marilyn Rhodes , a 30 y.o. female  was evaluated in triage.  Patient complaints of N/V/D this AM. TNTC episodes of emesis, 1 episode of diarrhea. Feeling generally weak and lightheaded with near syncope with these sxs. IV zofran by EMS with some improvement.   Review of Systems  Positive: N/V/D, generalized weakness, lightheadedness.  Negative: Abdominal pain, chest pain.   Physical Exam  BP 107/72 (BP Location: Left Arm)   Pulse 68   Temp 97.8 F (36.6 C) (Oral)   Resp 16   Ht 5\' 8"  (1.727 m)   Wt 104.3 kg   SpO2 99%   BMI 34.97 kg/m  Gen:   Awake, no distress   Resp:  Normal effort  MSK:   Moves extremities without difficulty  Other:  Nontender abdominal exam.   Medical Decision Making  Medically screening exam initiated at 12:57 PM.  Appropriate orders placed.  Marilyn Rhodes was informed that the remainder of the evaluation will be completed by another provider, this initial triage assessment does not replace that evaluation, and the importance of remaining in the ED until their evaluation is complete.  N/V/D, weakness.    Joselyn Arrow 09/05/20 1258    09/07/20, MD 09/05/20 [phone]

## 2020-09-18 ENCOUNTER — Other Ambulatory Visit: Payer: Self-pay

## 2020-09-18 ENCOUNTER — Emergency Department (HOSPITAL_BASED_OUTPATIENT_CLINIC_OR_DEPARTMENT_OTHER): Admission: EM | Admit: 2020-09-18 | Discharge: 2020-09-18 | Discharge status: Absent without leave | Payer: Self-pay

## 2020-09-18 ENCOUNTER — Emergency Department (HOSPITAL_COMMUNITY): Admission: EM | Admit: 2020-09-18 | Discharge: 2020-09-18 | Discharge status: Against Medical Advice | Payer: Self-pay

## 2020-09-18 NOTE — ED Notes (Signed)
Pt stated she was leaving and going to see an orthopedic instead.
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
## 2020-11-23 ENCOUNTER — Encounter (HOSPITAL_COMMUNITY): Payer: Self-pay | Admitting: Emergency Medicine

## 2021-01-13 ENCOUNTER — Other Ambulatory Visit: Payer: Self-pay

## 2021-01-13 ENCOUNTER — Encounter (HOSPITAL_BASED_OUTPATIENT_CLINIC_OR_DEPARTMENT_OTHER): Payer: Self-pay | Admitting: *Deleted

## 2021-01-13 ENCOUNTER — Emergency Department (HOSPITAL_BASED_OUTPATIENT_CLINIC_OR_DEPARTMENT_OTHER)
Admission: EM | Admit: 2021-01-13 | Discharge: 2021-01-13 | Disposition: A | Discharge status: Home / Self Care | Payer: Self-pay | Attending: Emergency Medicine | Admitting: Emergency Medicine

## 2021-01-13 DIAGNOSIS — E860 Dehydration: Secondary | ICD-10-CM | POA: Insufficient documentation

## 2021-01-13 DIAGNOSIS — G90A Postural orthostatic tachycardia syndrome (POTS): Secondary | ICD-10-CM | POA: Insufficient documentation

## 2021-01-13 MED ORDER — SODIUM CHLORIDE 0.9 % IV BOLUS
1000.0000 mL | Freq: Once | INTRAVENOUS | Status: AC
  Administered 2021-01-13: 1000 mL via INTRAVENOUS

## 2021-01-13 NOTE — ED Notes (Signed)
RT Note: Pt. ambulated per order from room to bathroom with W/C @ side, v/s are as follows: Pts. Room:  HR-77/Sat-99% Restroom:   HR-108/Sat-92% Pts. Room:  HR-67/Sat-98%  Pt. stated, "she was dizzy" upon standing in room from getting out of bed before ambulating and stopped X2 on way to restroom and on way back to room, v/s updated, Joe, RN made aware, Physician notified.

## 2021-01-13 NOTE — ED Triage Notes (Signed)
Pt arrives via [facility] from home. Per report by medic, pt does not feel well and fells like she needs fluids. Hx of POTS En route vss, 138/88, hr 60.

## 2021-01-13 NOTE — ED Provider Notes (Signed)
[facility] EMERGENCY DEPT Provider Note   CSN: [id number] Arrival date & time: 01/13/21  [id number]     History Chief Complaint  Patient presents with   Dehydration    Marilyn Rhodes is a 30 y.o. female.  30 yo F with a chief complaints of feeling fatigued.  The patient states that this typically happens when she has a flare of her POTS.  Started abruptly this morning.  Said that she woke up and did not feel well about 2 hours ago.  Was normal yesterday.  Denies cough congestion fever denies nausea vomiting or diarrhea denies decreased oral intake.  The patient has run out of her chronic medications and is trying to reestablish with her cardiologist at [facility].  The history is provided by the patient.  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)    Benign positional vertigo    Bilateral shoulder pain    Bilateral thoracic back pain    Carpal tunnel syndrome    Cough    Dizziness    Dysmenorrhea    ECG abnormal    Fatigue    Flu    History of degenerative disc disease    Hypercholesteremia    Hypomagnesemia    Iron deficiency    Lower respiratory tract infection    Lumbar back pain    Muscle spasm of back    Myalgia    Nausea    Oligomenorrhea    Palpitations    Pneumonia, bacterial    Pre-diabetes    Right wrist pain    SOB (shortness of breath) on exertion    Vitamin D deficiency     There are no problems to display for this patient.   Past Surgical History:  Procedure Laterality Date   ABLATION     LOOP RECORDER INSERTION       OB  History   No obstetric history on file.     Family History  Problem Relation Age of Onset   Endometriosis Mother    Factor VIII deficiency Father     Social History   Tobacco Use   Smoking status: Never   Smokeless tobacco: Never  Vaping Use   Vaping Use: Never used  Substance Use Topics   Alcohol use: Not Currently   Drug use: Not Currently    Home Medications Prior to Admission medications   Medication Sig Start Date End Date Taking? Authorizing Provider  ALPRAZolam Duanne Moron) 0.25 MG tablet Take 0.25 mg by mouth at bedtime as needed for anxiety.    [provider]  atenolol (TENORMIN) 25 MG tablet Take 25 mg by mouth daily.    [provider]  busPIRone (BUSPAR) 5 MG tablet Take 5 mg by mouth 3 (three) times daily.    [provider]  etodolac (LODINE XL) 400 MG 24 hr tablet Take 400 mg by mouth daily as needed.     [provider]  Ferrous Sulfate (IRON) 142 (45 Fe) MG TBCR Take by mouth daily.    [provider]  Fluticasone Furoate (ARNUITY ELLIPTA) 100  MCG/ACT AEPB Inhale 100 mcg into the lungs daily.    [provider]  HYDROcodone-acetaminophen (NORCO/VICODIN) 5-325 MG tablet Take 1 tablet by mouth every 4 (four) hours as needed. 06/15/20   Jacalyn Lefevre, MD  medroxyPROGESTERone (DEPO-PROVERA) 150 MG/ML injection Inject 150 mg into the muscle every 3 (three) months.    [provider]  methocarbamol (ROBAXIN) 500 MG tablet Take 1 tablet (500 mg total) by mouth 2 (two) times daily. 07/25/20   Sabas Sous, MD  midodrine (PROAMATINE) 2.5 MG tablet Take 2.5 mg by mouth 3 (three) times daily with meals.    [provider]  sertraline (ZOLOFT) 50 MG tablet Take 50 mg by mouth daily.    [provider]  Vitamin D, Ergocalciferol, (DRISDOL) 1.25 MG (50000 UT) CAPS capsule Take 50,000 Units by mouth every 7 (seven) days.    [provider]    Allergies    Albuterol and  Penicillins  Review of Systems   Review of Systems  Constitutional:  Positive for fatigue. Negative for chills and fever.  HENT:  Negative for congestion and rhinorrhea.   Eyes:  Negative for redness and visual disturbance.  Respiratory:  Negative for shortness of breath and wheezing.   Cardiovascular:  Negative for chest pain and palpitations.  Gastrointestinal:  Negative for nausea and vomiting.  Genitourinary:  Negative for dysuria and urgency.  Musculoskeletal:  Negative for arthralgias and myalgias.  Skin:  Negative for pallor and wound.  Neurological:  Negative for dizziness and headaches.   Physical Exam Updated Vital Signs BP 119/81 (BP Location: Left Arm)   Pulse (!) 59   Temp 98.1 F (36.7 C) (Oral)   Resp 18   SpO2 98%   Physical Exam Vitals and nursing note reviewed.  Constitutional:      General: She is not in acute distress.    Appearance: She is well-developed. She is not diaphoretic.  HENT:     Head: Normocephalic and atraumatic.  Eyes:     Pupils: Pupils are equal, round, and reactive to light.  Cardiovascular:     Rate and Rhythm: Normal rate and regular rhythm.     Heart sounds: No murmur heard.   No friction rub. No gallop.  Pulmonary:     Effort: Pulmonary effort is normal.     Breath sounds: No wheezing or rales.  Abdominal:     General: There is no distension.     Palpations: Abdomen is soft.     Tenderness: There is no abdominal tenderness.  Musculoskeletal:        General: No tenderness.     Cervical back: Normal range of motion and neck supple.  Skin:    General: Skin is warm and dry.  Neurological:     Mental Status: She is alert and oriented to person, place, and time.  Psychiatric:        Behavior: Behavior normal.    ED Results / Procedures / Treatments   Labs (all labs ordered are listed, but only abnormal results are displayed) Labs Reviewed  PREGNANCY, URINE    EKG None  Radiology No results  found.  Procedures Procedures   Medications Ordered in ED Medications  sodium chloride 0.9 % bolus 1,000 mL (1,000 mLs Intravenous New Bag/Given 01/13/21 1017)    ED Course  I have reviewed the triage vital signs and the nursing notes.  Pertinent labs & imaging results that were available during my care of the patient were reviewed by me  and considered in my medical decision making (see chart for details).    MDM Rules/Calculators/A&P                           30 yo  F with a chief complaints of what she thinks is a POTS flare.  The patient woke up about 2 hours ago and just felt unwell.  Has trouble describing it otherwise.  Feels typical of her prior episodes.  She is out of her midodrine and plans on following up with her cardiologist that she get this refilled.  Seems unlikely to be a electrolyte abnormality or acute anemia based on her symptoms.  We will give a bolus of IV fluids check a pregnancy test reassess.   Patient feels like she is much better after bolus of IV fluids.  She had a couple episodes of feeling lightheaded while she was ambulating here but think she feels good enough to go home.  We will have her contact her cardiologist.  9:28 AM:  I have discussed the diagnosis/risks/treatment options with the patient and believe the pt to be eligible for discharge home to follow-up with PCP, Cards. We also discussed returning to the ED immediately if new or worsening sx occur. We discussed the sx which are most concerning (e.g., sudden worsening pain, fever, inability to tolerate by mouth, unsafe at home) that necessitate immediate return. Medications administered to the patient during their visit and any new prescriptions provided to the patient are listed below.  Medications given during this visit Medications  sodium chloride 0.9 % bolus 1,000 mL (1,000 mLs Intravenous New Bag/Given 01/13/21 [id number])     The patient appears reasonably screen and/or stabilized for discharge  and I doubt any other medical condition or other [facility] requiring further screening, evaluation, or treatment in the ED at this time prior to discharge.   Final Clinical Impression(s) / ED Diagnoses Final diagnoses:  POTS (postural orthostatic tachycardia syndrome)    Rx / DC Orders ED Discharge Orders     None        Deno Etienne, DO 01/13/21 [phone]

## 2021-01-13 NOTE — Discharge Instructions (Signed)
Please eat and drink as well as you can.  Please return if you feel like you are unsafe at home.  Follow-up with your cardiologist.

## 2021-01-13 NOTE — ED Triage Notes (Signed)
Pt reports for the last hour has "felt lightheaded and weak". Denies pain.

## 2021-05-05 ENCOUNTER — Emergency Department (HOSPITAL_BASED_OUTPATIENT_CLINIC_OR_DEPARTMENT_OTHER)
Admission: EM | Admit: 2021-05-05 | Discharge: 2021-05-05 | Disposition: A | Discharge status: Home / Self Care | Payer: Self-pay | Attending: Emergency Medicine | Admitting: Emergency Medicine

## 2021-05-05 ENCOUNTER — Other Ambulatory Visit: Payer: Self-pay

## 2021-05-05 ENCOUNTER — Encounter (HOSPITAL_BASED_OUTPATIENT_CLINIC_OR_DEPARTMENT_OTHER): Payer: Self-pay | Admitting: Emergency Medicine

## 2021-05-05 DIAGNOSIS — E876 Hypokalemia: Secondary | ICD-10-CM | POA: Insufficient documentation

## 2021-05-05 DIAGNOSIS — R42 Dizziness and giddiness: Secondary | ICD-10-CM | POA: Insufficient documentation

## 2021-05-05 DIAGNOSIS — R112 Nausea with vomiting, unspecified: Secondary | ICD-10-CM | POA: Insufficient documentation

## 2021-05-05 LAB — CBC
HCT: 39.2 % (ref 36.0–46.0)
Hemoglobin: 12.9 g/dL (ref 12.0–15.0)
MCH: 30.9 pg (ref 26.0–34.0)
MCHC: 32.9 g/dL (ref 30.0–36.0)
MCV: 93.8 fL (ref 80.0–100.0)
Platelets: 215 10*3/uL (ref 150–400)
RBC: 4.18 MIL/uL (ref 3.87–5.11)
RDW: 13.8 % (ref 11.5–15.5)
WBC: 9.2 10*3/uL (ref 4.0–10.5)
nRBC: 0 % (ref 0.0–0.2)

## 2021-05-05 LAB — BASIC METABOLIC PANEL
Anion gap: 9 (ref 5–15)
BUN: 7 mg/dL (ref 6–20)
CO2: 24 mmol/L (ref 22–32)
Calcium: 9.4 mg/dL (ref 8.9–10.3)
Chloride: 106 mmol/L (ref 98–111)
Creatinine, Ser: 0.87 mg/dL (ref 0.44–1.00)
GFR, Estimated: >60 mL/min (ref >60)
Glucose, Bld: 109 mg/dL — ABNORMAL HIGH (ref 70–99)
Potassium: 3.4 mmol/L — ABNORMAL LOW (ref 3.5–5.1)
Sodium: 139 mmol/L (ref 135–145)

## 2021-05-05 LAB — HCG, SERUM, QUALITATIVE: Preg, Serum: NEGATIVE

## 2021-05-05 MED ORDER — LORAZEPAM 2 MG/ML IJ SOLN
1.0000 mg | Freq: Once | INTRAMUSCULAR | Status: AC
  Administered 2021-05-05: 1 mg via INTRAVENOUS
  Filled 2021-05-05: qty 1

## 2021-05-05 MED ORDER — ONDANSETRON HCL 4 MG/2ML IJ SOLN
4.0000 mg | Freq: Once | INTRAMUSCULAR | Status: AC | PRN
  Administered 2021-05-05: 4 mg via INTRAVENOUS
  Filled 2021-05-05: qty 2

## 2021-05-05 MED ORDER — SODIUM CHLORIDE 0.9 % IV BOLUS
1000.0000 mL | Freq: Once | INTRAVENOUS | Status: AC
  Administered 2021-05-05: 1000 mL via INTRAVENOUS

## 2021-05-05 NOTE — ED Provider Notes (Signed)
?[facility] EMERGENCY DEPT ?Provider Note ? ? ?CSN: [id number] ?Arrival date & time: 05/05/21  1008 ? ?  ? ?History ? ?Chief Complaint  ?Patient presents with  ? Dizziness  ? ? ?Marilyn Rhodes is a 31 y.o. female with PMHx vertigo and POTS who presents to the ED today with complaint of room spinning dizziness that began this morning upon waking up.  Patient also complains of nausea and nonbloody nonbilious emesis.  She states she is unsure if this is related to her history of vertigo or POTS she states she has similar symptoms with both.  She does admit that she has been out of her midodrine and atenolol for several months as she does not currently have insurance.  She does state that she took a meclizine tablet around 7 AM this morning without relief.  She denies any blurry vision, double vision, chest pain, shortness of breath, abdominal pain, severe headache, or any other associated symptoms.  ? ?The history is provided by the patient and medical records.  ? ?  ? ?Home Medications ?Prior to Admission medications   ?Medication Sig Start Date End Date Taking? Authorizing Provider  ?ALPRAZolam (XANAX) 0.25 MG tablet Take 0.25 mg by mouth at bedtime as needed for anxiety.    [provider]  ?atenolol (TENORMIN) 25 MG tablet Take 25 mg by mouth daily.    [provider]  ?busPIRone (BUSPAR) 5 MG tablet Take 5 mg by mouth 3 (three) times daily.    [provider]  ?etodolac (LODINE XL) 400 MG 24 hr tablet Take 400 mg by mouth daily as needed.     [provider]  ?Ferrous Sulfate (IRON) 142 (45 Fe) MG TBCR Take by mouth daily.    [provider]  ?Fluticasone Furoate (ARNUITY ELLIPTA) 100 MCG/ACT AEPB Inhale 100 mcg into the lungs daily.    [provider]  ?HYDROcodone-acetaminophen (NORCO/VICODIN) 5-325 MG tablet Take 1 tablet by mouth every 4 (four) hours as needed. 06/15/20   Jacalyn Lefevre, MD  ?medroxyPROGESTERone (DEPO-PROVERA) 150 MG/ML injection  Inject 150 mg into the muscle every 3 (three) months.    [provider]  ?methocarbamol (ROBAXIN) 500 MG tablet Take 1 tablet (500 mg total) by mouth 2 (two) times daily. 07/25/20   Sabas Sous, MD  ?midodrine (PROAMATINE) 2.5 MG tablet Take 2.5 mg by mouth 3 (three) times daily with meals.    [provider]  ?sertraline (ZOLOFT) 50 MG tablet Take 50 mg by mouth daily.    [provider]  ?Vitamin D, Ergocalciferol, (DRISDOL) 1.25 MG (50000 UT) CAPS capsule Take 50,000 Units by mouth every 7 (seven) days.    [provider]  ?   ? ?Allergies    ?Albuterol and Penicillins   ? ?Review of Systems   ?Review of Systems  ?Constitutional:  Negative for chills and fever.  ?Eyes:  Negative for visual disturbance.  ?Respiratory:  Negative for shortness of breath.   ?Cardiovascular:  Negative for chest pain.  ?Gastrointestinal:  Positive for nausea and vomiting. Negative for abdominal pain.  ?Neurological:  Positive for dizziness. Negative for syncope, speech difficulty, light-headedness and headaches.  ?All other systems reviewed and are negative. ? ?Physical Exam ?Updated Vital Signs ?BP 112/72   Pulse 70   Temp 98.9 ?F (37.2 ?C)   Resp 20   Ht 5\' 8"  (1.727 m)   Wt 104.3 kg   LMP 04/29/2021   SpO2 100%   BMI 34.96 kg/m?  ?Physical  Exam ?Vitals and nursing note reviewed.  ?Constitutional:   ?   Appearance: She is not ill-appearing or diaphoretic.  ?HENT:  ?   Head: Normocephalic and atraumatic.  ?Eyes:  ?   Extraocular Movements: Extraocular movements intact.  ?   Conjunctiva/sclera: Conjunctivae normal.  ?   Pupils: Pupils are equal, round, and reactive to light.  ?Cardiovascular:  ?   Rate and Rhythm: Normal rate and regular rhythm.  ?   Pulses: Normal pulses.  ?Pulmonary:  ?   Effort: Pulmonary effort is normal.  ?   Breath sounds: Normal breath sounds. No wheezing, rhonchi or rales.  ?Abdominal:  ?   Palpations: Abdomen is soft.  ?   Tenderness: There is no abdominal  tenderness.  ?Musculoskeletal:  ?   Cervical back: Neck supple.  ?Skin: ?   General: Skin is warm and dry.  ?Neurological:  ?   Mental Status: She is alert.  ?   Comments: Alert and oriented to self, place, time and event.  ? ?Speech is fluent, clear without dysarthria or dysphasia.  ? ?Strength 5/5 in upper/lower extremities   ?Sensation intact in upper/lower extremities  ? ?No pronator drift.  ?Normal finger-to-nose and feet tapping.  ?CN I not tested  ?CN II grossly intact visual fields bilaterally. Did not visualize posterior eye.  ?CN III, IV, VI PERRLA and EOMs intact bilaterally  ?CN V Intact sensation to sharp and light touch to the face  ?CN VII facial movements symmetric  ?CN VIII not tested  ?CN IX, X no uvula deviation, symmetric rise of soft palate  ?CN XI 5/5 SCM and trapezius strength bilaterally  ?CN XII Midline tongue protrusion, symmetric L/R movements   ? ? ?ED Results / Procedures / Treatments   ?Labs ?(all labs ordered are listed, but only abnormal results are displayed) ?Labs Reviewed  ?BASIC METABOLIC PANEL - Abnormal; Notable for the following components:  ?    Result Value  ? Potassium 3.4 (*)   ? Glucose, Bld 109 (*)   ? All other components within normal limits  ?CBC  ?HCG, SERUM, QUALITATIVE  ? ? ?EKG ?EKG Interpretation ? ?Date/Time:  Wednesday May 05 2021 10:19:12 EDT ?Ventricular Rate:  63 ?PR Interval:  144 ?QRS Duration: 86 ?QT Interval:  440 ?QTC Calculation: 450 ?R Axis:   90 ?Text Interpretation: Normal sinus rhythm Rightward axis Borderline ECG When compared with ECG of 27-Mar-2018 11:40, Premature atrial complexes are no longer Present Vent. rate has decreased BY  38 BPM T wave inversion no longer evident in Inferior leads T wave inversion no longer evident in Anterolateral leads Confirmed by Ernie AvenaLawsing, James ([id number]) on 05/05/2021 11:41:11 AM ? ?Radiology ?No results found. ? ?Procedures ?Procedures  ? ? ?Medications Ordered in ED ?Medications  ?ondansetron (ZOFRAN) injection 4 mg  (4 mg Intravenous Given 05/05/21 1153)  ?sodium chloride 0.9 % bolus 1,000 mL (1,000 mLs Intravenous New Bag/Given 05/05/21 1224)  ?LORazepam (ATIVAN) injection 1 mg (1 mg Intravenous Given 05/05/21 1224)  ? ? ?ED Course/ Medical Decision Making/ A&P ?  ?                        ?Medical Decision Making ?31 year old female who presents to the ED today with complaint of room spinning dizziness.  History of vertigo and POTS, has been out of midodrine and atenolol for several months.  On arrival to the ED patient is nontachycardic with a pulse of 63.  Remainder vitals are  unremarkable.  She had lab work done including CBC, BMP, hCG level prior to being seen.  BMP with a slight hypo-K at 3.4.  No other electrolyte abnormalities.  On exam she appears to be resting comfortably in a darkened room however as I turn on the lights patient begins hyperventilating.  She is easily consolable.  She is able to follow commands without difficulty.  She has no focal neurodeficits on exam at this time however states her body feels weak.  She has taken meclizine around 7 AM this morning without relief.  She was given Zofran IV however continues to have dry heaving.  Will attempt Ativan at this time and fluids.  Will reassess afterwards.'s do not feel patient requires any imaging of her head or additional lab work at this time however may reconsider if no improvement with medications.  ? ?On reevaluation after Ativan and fluids patient reports improvement in symptoms.  No longer vomiting.  She has been able to ambulate successfully without assistance.  We will plan to discharge home.  Patient advised to follow-up with cardiology as she likely needs to be started back on her midodrine.  Her blood pressure has been stable here despite being off of atenolol for several months.  She is advised to follow-up with her PCP for her vertigo.  She is agreement plan and stable for discharge. ? ?Problems Addressed: ?Nausea and vomiting, unspecified  vomiting type: acute illness or injury ?Vertigo: acute illness or injury ? ?Amount and/or Complexity of Data Reviewed ?Labs: ordered. ? ?Risk ?Prescription drug management. ? ? ? ? ? ? ? ? ? ?Final Clinical Impression

## 2021-05-05 NOTE — Discharge Instructions (Signed)
Please follow-up with your PCP for further evaluation of your ED visit.  If you do not have a PCP currently you can follow-up with [facility] for further evaluation/primary care needs. ? ?It is also recommended that you follow-up with your cardiologist at [facility] as you likely need to be started back on your midodrine and atenolol.  ? ?Continue taking the meclizine that you have at home as needed for your vertigo symptoms.  ? ?Return to the ED for any new/worsening symptoms.  ?

## 2021-05-05 NOTE — ED Triage Notes (Signed)
Pt arrives to ED with c/o dizziness. This started this morning. Associated symptoms include nausea. Pt denies loss of vision. Pt reports hx vertigo and POTS.  ?

## 2021-05-05 NOTE — ED Notes (Signed)
Ambulated patient in hallway. HR  104  Oxygen 98 .  Said she was tired but okay. ?

## 2021-07-29 IMAGING — DX DG CHEST 2V
2 series · 2 of 2 positions shown · non-contrast
Comparison: 03/27/2018

CLINICAL DATA: Chest pain

EXAM:
CHEST - 2 VIEW

[chest pa]
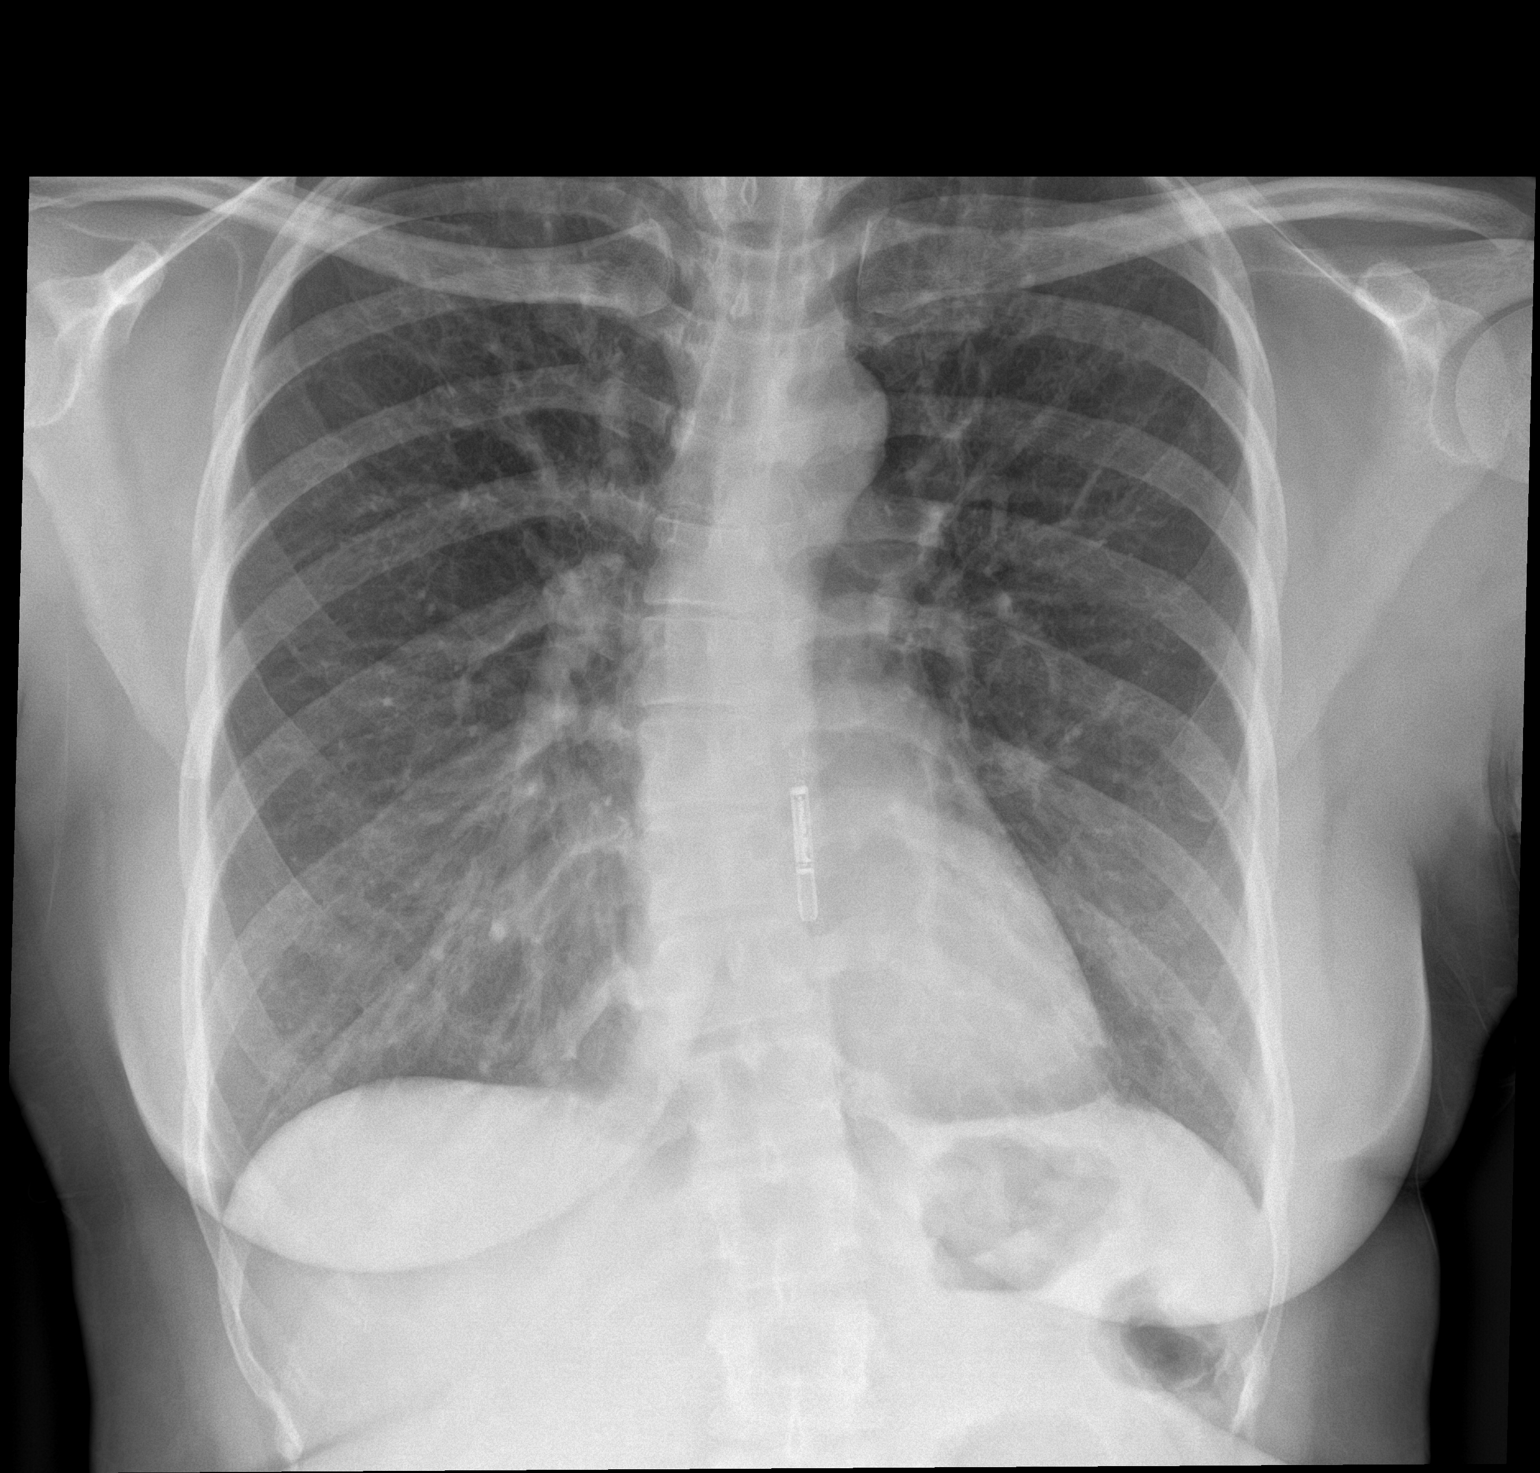

[chest lat]
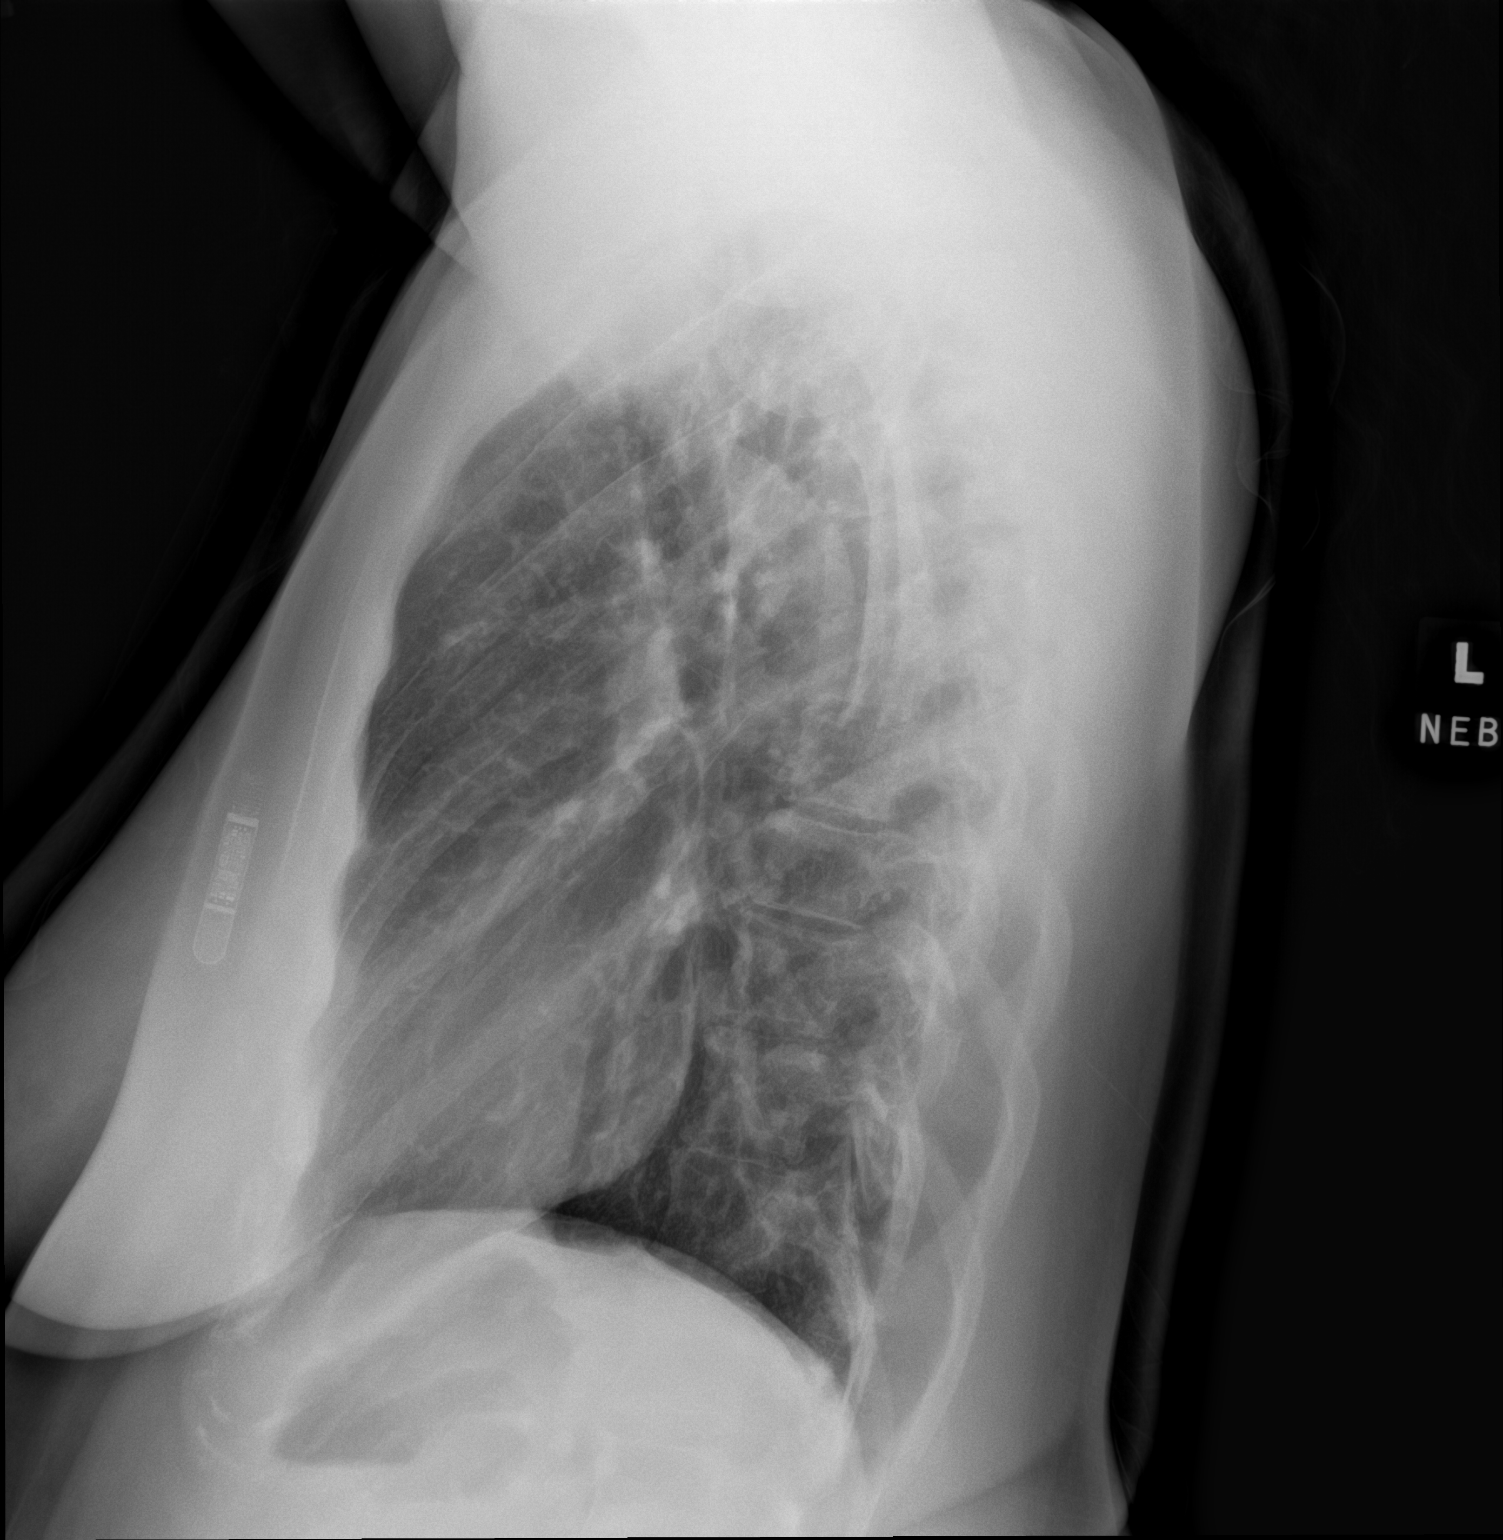

[2 of 2 positions shown; findings below may reference images not displayed]

FINDINGS: Electronic recording device over the chest. No focal opacity or
pleural effusion. Stable cardiomediastinal silhouette. No
pneumothorax. Scoliosis of the spine.
IMPRESSION: No active cardiopulmonary disease.
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
## 2022-06-01 LAB — RESULTS CONSOLE HPV: CHL HPV: NEGATIVE

## 2022-06-01 LAB — HM PAP SMEAR: HM Pap smear: NORMAL

## 2022-06-17 LAB — HM HIV SCREENING LAB: HM HIV Screening: NEGATIVE

## 2022-06-17 LAB — HM HEPATITIS C SCREENING LAB: HM Hepatitis Screen: NEGATIVE

## 2022-07-20 ENCOUNTER — Other Ambulatory Visit: Payer: Self-pay

## 2022-07-20 ENCOUNTER — Encounter (HOSPITAL_COMMUNITY): Payer: Self-pay

## 2022-07-20 ENCOUNTER — Emergency Department (HOSPITAL_COMMUNITY)
Admission: EM | Admit: 2022-07-20 | Discharge: 2022-07-20 | Disposition: A | Discharge status: Home / Self Care | Payer: 59 | Attending: Emergency Medicine | Admitting: Emergency Medicine

## 2022-07-20 DIAGNOSIS — R112 Nausea with vomiting, unspecified: Secondary | ICD-10-CM | POA: Insufficient documentation

## 2022-07-20 DIAGNOSIS — D72829 Elevated white blood cell count, unspecified: Secondary | ICD-10-CM | POA: Diagnosis not present

## 2022-07-20 LAB — CBC
HCT: 36.7 % (ref 36.0–46.0)
Hemoglobin: 12.5 g/dL (ref 12.0–15.0)
MCH: 32 pg (ref 26.0–34.0)
MCHC: 34.1 g/dL (ref 30.0–36.0)
MCV: 93.9 fL (ref 80.0–100.0)
Platelets: 202 10*3/uL (ref 150–400)
RBC: 3.91 MIL/uL (ref 3.87–5.11)
RDW: 13.7 % (ref 11.5–15.5)
WBC: 12.5 10*3/uL — ABNORMAL HIGH (ref 4.0–10.5)
nRBC: 0 % (ref 0.0–0.2)

## 2022-07-20 LAB — COMPREHENSIVE METABOLIC PANEL
ALT: 9 U/L (ref 0–44)
AST: 23 U/L (ref 15–41)
Albumin: 3.4 g/dL — ABNORMAL LOW (ref 3.5–5.0)
Alkaline Phosphatase: 32 U/L — ABNORMAL LOW (ref 38–126)
Anion gap: 10 (ref 5–15)
BUN: 9 mg/dL (ref 6–20)
CO2: 18 mmol/L — ABNORMAL LOW (ref 22–32)
Calcium: 8.5 mg/dL — ABNORMAL LOW (ref 8.9–10.3)
Chloride: 108 mmol/L (ref 98–111)
Creatinine, Ser: 0.9 mg/dL (ref 0.44–1.00)
GFR, Estimated: >60 mL/min (ref >60)
Glucose, Bld: 130 mg/dL — ABNORMAL HIGH (ref 70–99)
Potassium: 3.5 mmol/L (ref 3.5–5.1)
Sodium: 136 mmol/L (ref 135–145)
Total Bilirubin: 1.2 mg/dL (ref 0.3–1.2)
Total Protein: 6.8 g/dL (ref 6.5–8.1)

## 2022-07-20 LAB — LIPASE, BLOOD: Lipase: 25 U/L (ref 11–51)

## 2022-07-20 LAB — I-STAT BETA HCG BLOOD, ED (MC, WL, AP ONLY): I-stat hCG, quantitative: <5 m[IU]/mL (ref <5)

## 2022-07-20 MED ORDER — ONDANSETRON HCL 4 MG/2ML IJ SOLN
4.0000 mg | Freq: Once | INTRAMUSCULAR | Status: AC
  Administered 2022-07-20: 4 mg via INTRAVENOUS
  Filled 2022-07-20: qty 2

## 2022-07-20 MED ORDER — ONDANSETRON 4 MG PO TBDP: 4.0000 mg | ORAL_TABLET | Freq: Three times a day (TID) | ORAL | 0 refills | Status: DC | PRN

## 2022-07-20 MED ORDER — SODIUM CHLORIDE 0.9 % IV BOLUS
1000.0000 mL | Freq: Once | INTRAVENOUS | Status: AC
  Administered 2022-07-20: 1000 mL via INTRAVENOUS

## 2022-07-20 NOTE — Discharge Instructions (Signed)
You were seen today for an vomiting.  Your workup is reassuring.  You may have a viral illness.  Take Zofran at home and make sure that you are staying hydrated.

## 2022-07-20 NOTE — ED Notes (Signed)
  Patient given water for PO challenger and instructed to take small sips.  Will continue to monitor.

## 2022-07-20 NOTE — ED Provider Notes (Signed)
EMERGENCY DEPARTMENT AT [facility] Provider Note   CSN: [id number] Arrival date & time: 07/20/22  6213     History  Chief Complaint  Patient presents with   Emesis    Marilyn Rhodes is a 32 y.o. female.  HPI    This is a 32 year old female who presents with nausea vomiting.  Patient reports 1 day history of nausea and vomiting.  Describes nonbilious, nonbloody emesis.  No abdominal pain.  Denies alcohol or drug use, specifically marijuana.  Does not believe herself to be pregnant.  Denies fever or urinary symptoms.  Has not taken anything for her symptoms. Home Medications Prior to Admission medications   Medication Sig Start Date End Date Taking? Authorizing Provider  ondansetron (ZOFRAN-ODT) 4 MG disintegrating tablet Take 1 tablet (4 mg total) by mouth every 8 (eight) hours as needed for nausea or vomiting. 07/20/22  Yes Giancarlos Berendt, Mayer Masker, MD  ALPRAZolam Prudy Feeler) 0.25 MG tablet Take 0.25 mg by mouth at bedtime as needed for anxiety.    [provider]  atenolol (TENORMIN) 25 MG tablet Take 25 mg by mouth daily.    [provider]  busPIRone (BUSPAR) 5 MG tablet Take 5 mg by mouth 3 (three) times daily.    [provider]  etodolac (LODINE XL) 400 MG 24 hr tablet Take 400 mg by mouth daily as needed.     [provider]  Ferrous Sulfate (IRON) 142 (45 Fe) MG TBCR Take by mouth daily.    [provider]  Fluticasone Furoate (ARNUITY ELLIPTA) 100 MCG/ACT AEPB Inhale 100 mcg into the lungs daily.    [provider]  HYDROcodone-acetaminophen (NORCO/VICODIN) 5-325 MG tablet Take 1 tablet by mouth every 4 (four) hours as needed. 06/15/20   Jacalyn Lefevre, MD  medroxyPROGESTERone (DEPO-PROVERA) 150 MG/ML injection Inject 150 mg into the muscle every 3 (three) months.    [provider]  methocarbamol (ROBAXIN) 500 MG tablet Take 1 tablet (500 mg total) by mouth 2 (two) times daily. 07/25/20   Sabas Sous,  MD  midodrine (PROAMATINE) 2.5 MG tablet Take 2.5 mg by mouth 3 (three) times daily with meals.    [provider]  sertraline (ZOLOFT) 50 MG tablet Take 50 mg by mouth daily.    [provider]  Vitamin D, Ergocalciferol, (DRISDOL) 1.25 MG (50000 UT) CAPS capsule Take 50,000 Units by mouth every 7 (seven) days.    [provider]      Allergies    Albuterol and Penicillins    Review of Systems   Review of Systems  Constitutional:  Negative for fever.  Respiratory:  Negative for shortness of breath.   Cardiovascular:  Negative for chest pain.  Gastrointestinal:  Positive for nausea and vomiting. Negative for abdominal pain.  All other systems reviewed and are negative.   Physical Exam Updated Vital Signs BP (!) 102/59   Pulse (!) 50   Temp 98.3 F (36.8 C) (Oral)   Resp 18   Ht 1.727 m (5\' 8" )   Wt 93 kg   SpO2 100%   BMI 31.17 kg/m  Physical Exam Vitals and nursing note reviewed.  Constitutional:      Appearance: She is well-developed.  HENT:     Head: Normocephalic and atraumatic.  Eyes:     Pupils: Pupils are equal, round, and reactive to light.  Cardiovascular:     Rate and Rhythm: Normal rate and regular rhythm.     Heart sounds:  Normal heart sounds.  Pulmonary:     Effort: Pulmonary effort is normal. No respiratory distress.     Breath sounds: No wheezing.  Abdominal:     General: Bowel sounds are normal.     Palpations: Abdomen is soft.     Tenderness: There is no abdominal tenderness. There is no guarding or rebound.  Musculoskeletal:     Cervical back: Neck supple.  Skin:    General: Skin is warm and dry.  Neurological:     Mental Status: She is alert and oriented to person, place, and time.  Psychiatric:        Mood and Affect: Mood normal.     ED Results / Procedures / Treatments   Labs (all labs ordered are listed, but only abnormal results are displayed) Labs Reviewed  COMPREHENSIVE METABOLIC PANEL - Abnormal;  Notable for the following components:      Result Value   CO2 18 (*)    Glucose, Bld 130 (*)    Calcium 8.5 (*)    Albumin 3.4 (*)    Alkaline Phosphatase 32 (*)    All other components within normal limits  CBC - Abnormal; Notable for the following components:   WBC 12.5 (*)    All other components within normal limits  LIPASE, BLOOD  URINALYSIS, ROUTINE W REFLEX MICROSCOPIC  I-STAT BETA HCG BLOOD, ED ([facility], [facility], [facility] ONLY)    EKG None  Radiology No results found.  Procedures Procedures    Medications Ordered in ED Medications  sodium chloride 0.9 % bolus 1,000 mL (1,000 mLs Intravenous New Bag/Given 07/20/22 0130)  ondansetron (ZOFRAN) injection 4 mg (4 mg Intravenous Given 07/20/22 0130)    ED Course/ Medical Decision Making/ A&P                             Medical Decision Making Amount and/or Complexity of Data Reviewed Labs: ordered.  Risk Prescription drug management.   This patient presents to the ED for concern of nausea and vomiting, this involves an extensive number of treatment options, and is a complaint that carries with it a high risk of complications and morbidity.  I considered the following differential and admission for this acute, potentially life threatening condition.  The differential diagnosis includes gastritis, gastroenteritis, SBO  MDM:    This is a 32 year old female who presents with nausea and vomiting.  She is overall nontoxic-appearing and vital signs are reassuring.  Her [place] is nontender.  She denies any alcohol or drug use specifically marijuana.  Labs obtained and reviewed.  Mild leukocytosis to 12.5.  No significant metabolic derangements except for likely low bicarb.  Patient was given fluids and Zofran.  She is able to orally hydrate.  She is resting comfortably on multiple repeat evaluations.  Given absence of abdominal tenderness, do not feel she needs advanced imaging at this time.  Will send home with Zofran for likely viral  gastritis/gastroenteritis.  (Labs, imaging, consults)  Labs: I Ordered, and personally interpreted labs.  The pertinent results include: CBC, CMP, lipase, beta-hCG  Imaging Studies ordered: I ordered imaging studies including none I independently visualized and interpreted imaging. I agree with the radiologist interpretation  Additional history obtained from chart review.  External records from outside source obtained and reviewed including prior evaluations  Cardiac Monitoring: The patient was maintained on a cardiac monitor.  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)    Benign positional vertigo    Bilateral shoulder pain    Bilateral thoracic back pain    Carpal tunnel syndrome    Cough    Dizziness    Dysmenorrhea    ECG abnormal    Fatigue    Flu    History of degenerative disc disease    Hypercholesteremia    Hypomagnesemia    Iron deficiency    Lower respiratory tract infection    Lumbar back pain    Muscle spasm of back    Myalgia    Nausea    Oligomenorrhea    Palpitations    Pneumonia, bacterial    Pre-diabetes    Right wrist pain    SOB (shortness of breath) on exertion    Vitamin D deficiency      Medicines Meds ordered this encounter  Medications   sodium chloride 0.9 % bolus 1,000 mL   ondansetron (ZOFRAN) injection 4 mg   ondansetron (ZOFRAN-ODT) 4 MG disintegrating tablet    Sig: Take 1 tablet (4 mg total) by mouth every 8 (eight) hours as needed for nausea or vomiting.     Dispense:  20 tablet    Refill:  0    I have reviewed the patients home medicines and have made adjustments as needed  Problem List / ED Course: Problem List Items Addressed This Visit   None Visit Diagnoses     Nausea and vomiting, unspecified vomiting type    -  Primary                  Final Clinical Impression(s) / ED Diagnoses Final diagnoses:  Nausea and vomiting, unspecified vomiting type    Rx / DC Orders ED Discharge Orders          Ordered    ondansetron (ZOFRAN-ODT) 4 MG disintegrating tablet  Every 8 hours PRN        07/20/22 0325              Shon Baton, MD 07/20/22 [phone]

## 2022-07-20 NOTE — ED Triage Notes (Signed)
Patient arrives with EMS for nausea and vomiting. Patient is from home. States she has been vomiting all day. Zofran given with EMS

## 2022-09-15 DIAGNOSIS — M25562 Pain in left knee: Secondary | ICD-10-CM | POA: Diagnosis not present

## 2022-09-15 DIAGNOSIS — Z3040 Encounter for surveillance of contraceptives, unspecified: Secondary | ICD-10-CM | POA: Diagnosis not present

## 2022-09-16 DIAGNOSIS — F411 Generalized anxiety disorder: Secondary | ICD-10-CM | POA: Diagnosis not present

## 2022-09-22 DIAGNOSIS — M25562 Pain in left knee: Secondary | ICD-10-CM | POA: Diagnosis not present

## 2022-09-23 DIAGNOSIS — F411 Generalized anxiety disorder: Secondary | ICD-10-CM | POA: Diagnosis not present

## 2022-09-26 DIAGNOSIS — M2392 Unspecified internal derangement of left knee: Secondary | ICD-10-CM | POA: Diagnosis not present

## 2022-09-30 DIAGNOSIS — F411 Generalized anxiety disorder: Secondary | ICD-10-CM | POA: Diagnosis not present

## 2022-10-04 DIAGNOSIS — F411 Generalized anxiety disorder: Secondary | ICD-10-CM | POA: Diagnosis not present

## 2022-10-11 DIAGNOSIS — F411 Generalized anxiety disorder: Secondary | ICD-10-CM | POA: Diagnosis not present

## 2022-10-20 DIAGNOSIS — F411 Generalized anxiety disorder: Secondary | ICD-10-CM | POA: Diagnosis not present

## 2022-10-28 DIAGNOSIS — M2392 Unspecified internal derangement of left knee: Secondary | ICD-10-CM | POA: Diagnosis not present

## 2022-10-28 DIAGNOSIS — F411 Generalized anxiety disorder: Secondary | ICD-10-CM | POA: Diagnosis not present

## 2022-10-28 DIAGNOSIS — M25562 Pain in left knee: Secondary | ICD-10-CM | POA: Diagnosis not present

## 2022-11-01 DIAGNOSIS — F411 Generalized anxiety disorder: Secondary | ICD-10-CM | POA: Diagnosis not present

## 2022-11-08 DIAGNOSIS — F411 Generalized anxiety disorder: Secondary | ICD-10-CM | POA: Diagnosis not present

## 2022-11-15 DIAGNOSIS — F411 Generalized anxiety disorder: Secondary | ICD-10-CM | POA: Diagnosis not present

## 2022-11-22 DIAGNOSIS — F411 Generalized anxiety disorder: Secondary | ICD-10-CM | POA: Diagnosis not present

## 2022-11-29 DIAGNOSIS — F411 Generalized anxiety disorder: Secondary | ICD-10-CM | POA: Diagnosis not present

## 2022-12-06 DIAGNOSIS — F411 Generalized anxiety disorder: Secondary | ICD-10-CM | POA: Diagnosis not present

## 2022-12-16 DIAGNOSIS — F411 Generalized anxiety disorder: Secondary | ICD-10-CM | POA: Diagnosis not present

## 2022-12-20 DIAGNOSIS — F411 Generalized anxiety disorder: Secondary | ICD-10-CM | POA: Diagnosis not present

## 2022-12-29 DIAGNOSIS — F411 Generalized anxiety disorder: Secondary | ICD-10-CM | POA: Diagnosis not present

## 2023-01-06 DIAGNOSIS — F411 Generalized anxiety disorder: Secondary | ICD-10-CM | POA: Diagnosis not present

## 2023-01-09 DIAGNOSIS — F411 Generalized anxiety disorder: Secondary | ICD-10-CM | POA: Diagnosis not present

## 2023-01-17 DIAGNOSIS — F411 Generalized anxiety disorder: Secondary | ICD-10-CM | POA: Diagnosis not present

## 2023-01-22 ENCOUNTER — Emergency Department (HOSPITAL_COMMUNITY)
Admission: EM | Admit: 2023-01-22 | Discharge: 2023-01-22 | Discharge status: Against Medical Advice | Payer: Commercial Managed Care - PPO | Attending: Emergency Medicine | Admitting: Emergency Medicine

## 2023-01-22 ENCOUNTER — Encounter (HOSPITAL_COMMUNITY): Payer: Self-pay

## 2023-01-22 ENCOUNTER — Other Ambulatory Visit: Payer: Self-pay

## 2023-01-22 DIAGNOSIS — R531 Weakness: Secondary | ICD-10-CM | POA: Insufficient documentation

## 2023-01-22 DIAGNOSIS — R42 Dizziness and giddiness: Secondary | ICD-10-CM | POA: Insufficient documentation

## 2023-01-22 DIAGNOSIS — R112 Nausea with vomiting, unspecified: Secondary | ICD-10-CM | POA: Diagnosis not present

## 2023-01-22 DIAGNOSIS — M545 Low back pain, unspecified: Secondary | ICD-10-CM | POA: Diagnosis not present

## 2023-01-22 DIAGNOSIS — Z5321 Procedure and treatment not carried out due to patient leaving prior to being seen by health care provider: Secondary | ICD-10-CM | POA: Insufficient documentation

## 2023-01-22 LAB — CBC WITH DIFFERENTIAL/PLATELET
Abs Immature Granulocytes: 0.02 10*3/uL (ref 0.00–0.07)
Basophils Absolute: 0.1 10*3/uL (ref 0.0–0.1)
Basophils Relative: 1 %
Eosinophils Absolute: 0.1 10*3/uL (ref 0.0–0.5)
Eosinophils Relative: 1 %
HCT: 37.8 % (ref 36.0–46.0)
Hemoglobin: 12.7 g/dL (ref 12.0–15.0)
Immature Granulocytes: 0 %
Lymphocytes Relative: 25 %
Lymphs Abs: 2.5 10*3/uL (ref 0.7–4.0)
MCH: 32.1 pg (ref 26.0–34.0)
MCHC: 33.6 g/dL (ref 30.0–36.0)
MCV: 95.5 fL (ref 80.0–100.0)
Monocytes Absolute: 0.6 10*3/uL (ref 0.1–1.0)
Monocytes Relative: 6 %
Neutro Abs: 6.8 10*3/uL (ref 1.7–7.7)
Neutrophils Relative %: 67 %
Platelets: 222 10*3/uL (ref 150–400)
RBC: 3.96 MIL/uL (ref 3.87–5.11)
RDW: 14.2 % (ref 11.5–15.5)
WBC: 10.1 10*3/uL (ref 4.0–10.5)
nRBC: 0 % (ref 0.0–0.2)

## 2023-01-22 LAB — COMPREHENSIVE METABOLIC PANEL
ALT: 10 U/L (ref 0–44)
AST: 16 U/L (ref 15–41)
Albumin: 3.7 g/dL (ref 3.5–5.0)
Alkaline Phosphatase: 48 U/L (ref 38–126)
Anion gap: 12 (ref 5–15)
BUN: 6 mg/dL (ref 6–20)
CO2: 18 mmol/L — ABNORMAL LOW (ref 22–32)
Calcium: 9.1 mg/dL (ref 8.9–10.3)
Chloride: 110 mmol/L (ref 98–111)
Creatinine, Ser: 0.89 mg/dL (ref 0.44–1.00)
GFR, Estimated: >60 mL/min (ref >60)
Glucose, Bld: 109 mg/dL — ABNORMAL HIGH (ref 70–99)
Potassium: 2.9 mmol/L — ABNORMAL LOW (ref 3.5–5.1)
Sodium: 140 mmol/L (ref 135–145)
Total Bilirubin: 0.6 mg/dL (ref <1.2)
Total Protein: 6.5 g/dL (ref 6.5–8.1)

## 2023-01-22 LAB — HCG, SERUM, QUALITATIVE: Preg, Serum: NEGATIVE

## 2023-01-22 LAB — LIPASE, BLOOD: Lipase: 26 U/L (ref 11–51)

## 2023-01-22 MED ORDER — ONDANSETRON 4 MG PO TBDP
4.0000 mg | ORAL_TABLET | Freq: Once | ORAL | Status: AC
  Administered 2023-01-22: 4 mg via ORAL
  Filled 2023-01-22: qty 1

## 2023-01-22 NOTE — ED Notes (Signed)
Pt handed this NT labels and left ED.

## 2023-01-22 NOTE — ED Provider Triage Note (Signed)
Emergency Medicine Provider Triage Evaluation Note  Marilyn Rhodes , a 32 y.o. female  was evaluated in triage.  Pt complains of nausea, vomiting, and feeling of passing out that started this morning. Vomited 4x this morning. Last BM yesterday  Review of Systems  Positive: Nausea, vomiting Negative: fevers  Physical Exam  BP 133/86 (BP Location: Right Arm)   Pulse (!) 58   Temp 98 F (36.7 C)   Resp 20   Ht 5\' 8"  (1.727 m)   Wt 88.5 kg   LMP 01/22/2023   SpO2 96%   BMI 29.65 kg/m  Gen:   Awake, no distress   Resp:  Normal effort  MSK:   Moves extremities without difficulty  Other:    Medical Decision Making  Medically screening exam initiated at 1:20 PM.  Appropriate orders placed.  Marilyn Rhodes was informed that the remainder of the evaluation will be completed by another provider, this initial triage assessment does not replace that evaluation, and the importance of remaining in the ED until their evaluation is complete.  Labs and zofran ordered   Judithann Sheen, [place] 01/22/23 1323

## 2023-01-22 NOTE — ED Notes (Signed)
Pt stated she could not urinate cup given rn aware at

## 2023-01-22 NOTE — ED Triage Notes (Signed)
Pt states she is feeling lightheaded that started this morning and continues to feel like she is going to pass out. Pt vomiting in triage. Denies pain.

## 2023-01-24 DIAGNOSIS — F411 Generalized anxiety disorder: Secondary | ICD-10-CM | POA: Diagnosis not present

## 2023-02-28 ENCOUNTER — Ambulatory Visit: Payer: Commercial Managed Care - PPO | Admitting: Family Medicine

## 2023-03-06 ENCOUNTER — Ambulatory Visit: Payer: Commercial Managed Care - PPO | Admitting: Family Medicine

## 2023-03-06 ENCOUNTER — Other Ambulatory Visit: Payer: Self-pay | Admitting: Family Medicine

## 2023-03-18 ENCOUNTER — Telehealth: Payer: 59 | Admitting: Family Medicine

## 2023-03-18 DIAGNOSIS — N760 Acute vaginitis: Secondary | ICD-10-CM | POA: Diagnosis not present

## 2023-03-18 DIAGNOSIS — B9689 Other specified bacterial agents as the cause of diseases classified elsewhere: Secondary | ICD-10-CM

## 2023-03-18 MED ORDER — METRONIDAZOLE 500 MG PO TABS: 500.0000 mg | ORAL_TABLET | Freq: Two times a day (BID) | ORAL | 0 refills | Status: AC

## 2023-03-18 MED ORDER — METRONIDAZOLE 500 MG PO TABS: 500.0000 mg | ORAL_TABLET | Freq: Two times a day (BID) | ORAL | 0 refills | Status: DC

## 2023-03-18 NOTE — Addendum Note (Signed)
Addended by: Georgana Curio on: 03/18/2023 10:17 AM   Modules accepted: Orders

## 2023-03-18 NOTE — Progress Notes (Signed)

## 2023-03-20 DIAGNOSIS — F411 Generalized anxiety disorder: Secondary | ICD-10-CM | POA: Diagnosis not present

## 2023-03-22 ENCOUNTER — Telehealth: Payer: 59

## 2023-03-22 DIAGNOSIS — R051 Acute cough: Secondary | ICD-10-CM

## 2023-03-23 ENCOUNTER — Ambulatory Visit
Admission: RE | Admit: 2023-03-23 | Discharge: 2023-03-23 | Disposition: A | Discharge status: Home / Self Care | Payer: 59 | Source: Ambulatory Visit | Attending: Family Medicine | Admitting: Family Medicine

## 2023-03-23 ENCOUNTER — Ambulatory Visit (HOSPITAL_BASED_OUTPATIENT_CLINIC_OR_DEPARTMENT_OTHER)
Admission: RE | Admit: 2023-03-23 | Discharge: 2023-03-23 | Disposition: A | Discharge status: Home / Self Care | Payer: 59 | Source: Ambulatory Visit | Attending: Urgent Care | Admitting: Urgent Care

## 2023-03-23 VITALS — BP 122/80 | HR 90 | Temp 99.6°F | Resp 16

## 2023-03-23 DIAGNOSIS — R0602 Shortness of breath: Secondary | ICD-10-CM | POA: Diagnosis not present

## 2023-03-23 DIAGNOSIS — R059 Cough, unspecified: Secondary | ICD-10-CM | POA: Diagnosis not present

## 2023-03-23 DIAGNOSIS — R0989 Other specified symptoms and signs involving the circulatory and respiratory systems: Secondary | ICD-10-CM | POA: Insufficient documentation

## 2023-03-23 DIAGNOSIS — B349 Viral infection, unspecified: Secondary | ICD-10-CM | POA: Insufficient documentation

## 2023-03-23 DIAGNOSIS — J4530 Mild persistent asthma, uncomplicated: Secondary | ICD-10-CM | POA: Insufficient documentation

## 2023-03-23 LAB — POC COVID19/FLU A&B COMBO
Covid Antigen, POC: NEGATIVE
Influenza A Antigen, POC: NEGATIVE
Influenza B Antigen, POC: NEGATIVE

## 2023-03-23 MED ORDER — PREDNISONE 20 MG PO TABS
ORAL_TABLET | ORAL | 0 refills | Status: AC
Start: 2023-03-23 — End: 2023-03-31

## 2023-03-23 MED ORDER — PROMETHAZINE-DM 6.25-15 MG/5ML PO SYRP: 5.0000 mL | ORAL_SOLUTION | Freq: Three times a day (TID) | ORAL | 0 refills | Status: DC | PRN

## 2023-03-23 MED ORDER — BENZONATATE 100 MG PO CAPS
ORAL_CAPSULE | ORAL | 0 refills | Status: DC
Start: 2023-03-23 — End: 2023-10-02

## 2023-03-23 NOTE — Progress Notes (Signed)
 E-Visit for Cough     We are sorry that you are not feeling well.  Here is how we plan to help!   Based on your presentation I believe you most likely have A cough due to a virus.  This is called viral bronchitis and is best treated by rest, plenty of fluids and control of the cough.  You may use Ibuprofen or Tylenol as directed to help your symptoms.     In addition you may use A prescription cough medication called Tessalon Perles 100mg . You may take 1-2 capsules every 8 hours as needed for your cough. I also sent a Prednisone taper.         From your responses in the eVisit questionnaire you describe inflammation in the upper respiratory tract which is causing a significant cough.  This is commonly called Bronchitis and has four common causes:   Allergies Viral Infections Acid Reflux Bacterial Infection Allergies, viruses and acid reflux are treated by controlling symptoms or eliminating the cause. An example might be a cough caused by taking certain blood pressure medications. You stop the cough by changing the medication. Another example might be a cough caused by acid reflux. Controlling the reflux helps control the cough.   USE OF BRONCHODILATOR ("RESCUE") INHALERS: There is a risk from using your bronchodilator too frequently.  The risk is that over-reliance on a medication which only relaxes the muscles surrounding the breathing tubes can reduce the effectiveness of medications prescribed to reduce swelling and congestion of the tubes themselves.  Although you feel brief relief from the bronchodilator inhaler, your asthma may actually be worsening with the tubes becoming more swollen and filled with mucus.  This can delay other crucial treatments, such as oral steroid medications. If you need to use a bronchodilator inhaler daily, several times per day, you should discuss this with your provider.  There are probably better treatments that could be used to keep your asthma under control.                 HOME CARE Only take medications as instructed by your medical team. Complete the entire course of an antibiotic. Drink plenty of fluids and get plenty of rest. Avoid close contacts especially the very young and the elderly Cover your mouth if you cough or cough into your sleeve. Always remember to wash your hands A steam or ultrasonic humidifier can help congestion.    GET HELP RIGHT AWAY IF: You develop worsening fever. You become short of breath You cough up blood. Your symptoms persist after you have completed your treatment plan MAKE SURE YOU  Understand these instructions. Will watch your condition. Will get help right away if you are not doing well or get worse.     Thank you for choosing an e-visit.   Your e-visit answers were reviewed by a board certified advanced clinical practitioner to complete your personal care plan. Depending upon the condition, your plan could have included both over the counter or prescription medications.   Please review your pharmacy choice. Make sure the pharmacy is open so you can pick up prescription now. If there is a problem, you may contact your provider through [employer] and have the prescription routed to another pharmacy.  Your safety is important to [place]. If you have drug allergies check your prescription carefully.    For the next 24 hours you can use MyChart to ask questions about today's visit, request a non-urgent call back, or ask for  a work or school excuse. You will get an email in the next two days asking about your experience. I hope that your e-visit has been valuable and will speed your recovery.     I have spent 5 minutes in review of e-visit questionnaire, review and updating patient chart, medical decision making and response to patient.   Kasandra Knudsen Mayers, PA-C

## 2023-03-23 NOTE — ED Provider Notes (Signed)
 [facility] - URGENT CARE CENTER  Note:  This document was prepared using Conservation officer, historic buildings and may include unintentional dictation errors.  MRN: [id number] DOB: [birth year]  Subjective:   Marilyn Rhodes is a 33 y.o. female presenting for 2-day history of coughing, body pains, chest pain, chest congestion.  Patient was prescribed prednisone  from a virtual visit and cough capsules but has not picked this medication up yet.  She would like testing to be done.  She is concerned that she may have pneumonia as well.  Has exposure to many sick patients.  No current facility-administered medications for this encounter.  Current Outpatient Medications:    Pseudoeph-Doxylamine-DM-APAP (NYQUIL PO), Take by mouth., Disp: , Rfl:    ALPRAZolam (XANAX) 0.25 MG tablet, Take 0.25 mg by mouth at bedtime as needed for anxiety., Disp: , Rfl:    atenolol  (TENORMIN ) 25 MG tablet, Take 25 mg by mouth daily., Disp: , Rfl:    benzonatate  (TESSALON ) 100 MG capsule, Take 1-2 caps PO TID PRN, Disp: 20 capsule, Rfl: 0   etodolac (LODINE XL) 400 MG 24 hr tablet, Take 400 mg by mouth daily as needed. , Disp: , Rfl:    Ferrous Sulfate (IRON) 142 (45 Fe) MG TBCR, Take by mouth daily., Disp: , Rfl:    Fluticasone Furoate (ARNUITY ELLIPTA) 100 MCG/ACT AEPB, Inhale 100 mcg into the lungs daily., Disp: , Rfl:    HYDROcodone -acetaminophen  (NORCO/VICODIN) 5-325 MG tablet, Take 1 tablet by mouth every 4 (four) hours as needed., Disp: 10 tablet, Rfl: 0   medroxyPROGESTERone (DEPO-PROVERA) 150 MG/ML injection, Inject 150 mg into the muscle every 3 (three) months., Disp: , Rfl:    methocarbamol  (ROBAXIN ) 500 MG tablet, Take 1 tablet (500 mg total) by mouth 2 (two) times daily., Disp: 20 tablet, Rfl: 0   metroNIDAZOLE  (FLAGYL ) 500 MG tablet, Take 1 tablet (500 mg total) by mouth 2 (two) times daily for 7 days., Disp: 14 tablet, Rfl: 0   midodrine (PROAMATINE) 2.5 MG tablet, Take 2.5 mg by mouth 3 (three) times  daily with meals., Disp: , Rfl:    ondansetron  (ZOFRAN -ODT) 4 MG disintegrating tablet, Take 1 tablet (4 mg total) by mouth every 8 (eight) hours as needed for nausea or vomiting., Disp: 20 tablet, Rfl: 0   predniSONE  (DELTASONE ) 20 MG tablet, Take 3 tablets (60 mg total) by mouth daily with breakfast for 2 days, THEN 2 tablets (40 mg total) daily with breakfast for 2 days, THEN 1 tablet (20 mg total) daily with breakfast for 2 days, THEN 0.5 tablets (10 mg total) daily with breakfast for 2 days., Disp: 13 tablet, Rfl: 0   sertraline (ZOLOFT) 25 MG tablet, Take 25 mg by mouth daily., Disp: , Rfl:    traZODone  (DESYREL ) 50 MG tablet, Take by mouth., Disp: , Rfl:    Vitamin D , Ergocalciferol , (DRISDOL ) 1.25 MG (50000 UT) CAPS capsule, Take 50,000 Units by mouth every 7 (seven) days., Disp: , Rfl:    Allergies  Allergen Reactions   Penicillins Hives   Albuterol     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)    Benign positional vertigo    Bilateral shoulder pain    Bilateral thoracic back pain    Carpal tunnel syndrome    Cough    Dizziness    Dysmenorrhea    ECG abnormal    Fatigue    Flu  History of degenerative disc disease    Hypercholesteremia    Hypomagnesemia    Iron deficiency    Lower respiratory tract infection    Lumbar back pain    Muscle spasm of back    Myalgia    Nausea    Oligomenorrhea    Palpitations    Pneumonia, bacterial    Pre-diabetes    Right wrist pain    SOB (shortness of breath) on exertion    Vitamin D  deficiency      Past Surgical History:  Procedure Laterality Date   ABLATION     LOOP RECORDER INSERTION      Family History  Problem Relation Age of Onset   Endometriosis Mother    Factor VIII deficiency Father     Social History   Tobacco Use   Smoking status: Never   Smokeless tobacco: Never  Vaping Use   Vaping status: Never Used  Substance Use  Topics   Alcohol use: Not Currently   Drug use: Not Currently    ROS   Objective:   Vitals: BP 122/80 (BP Location: Right Arm)   Pulse 90   Temp 99.6 F (37.6 C) (Oral)   Resp 16   LMP 03/08/2023 (Exact Date)   SpO2 98%   Physical Exam Constitutional:      General: She is not in acute distress.    Appearance: Normal appearance. She is well-developed. She is not ill-appearing, toxic-appearing or diaphoretic.  HENT:     Head: Normocephalic and atraumatic.     Nose: Nose normal.     Mouth/Throat:     Mouth: Mucous membranes are moist.     Pharynx: No pharyngeal swelling, oropharyngeal exudate, posterior oropharyngeal erythema or uvula swelling.     Tonsils: No tonsillar exudate or tonsillar abscesses. 0 on the right. 0 on the left.  Eyes:     General: No scleral icterus.       Right eye: No discharge.        Left eye: No discharge.     Extraocular Movements: Extraocular movements intact.  Cardiovascular:     Rate and Rhythm: Normal rate and regular rhythm.     Heart sounds: Normal heart sounds. No murmur heard.    No friction rub. No gallop.  Pulmonary:     Effort: Pulmonary effort is normal. No respiratory distress.     Breath sounds: No stridor. No wheezing, rhonchi or rales.  Chest:     Chest wall: No tenderness.  Skin:    General: Skin is warm and dry.  Neurological:     General: No focal deficit present.     Mental Status: She is alert and oriented to person, place, and time.  Psychiatric:        Mood and Affect: Mood normal.        Behavior: Behavior normal.     Results for orders placed or performed during the hospital encounter of 03/23/23 (from the past 24 hours)  POC Covid19/Flu A&B Antigen     Status: Normal   Collection Time: 03/23/23 11:41 AM  Result Value Ref Range   Influenza A Antigen, POC Negative    Influenza B Antigen, POC Negative    Covid Antigen, POC Negative    DG Chest 2 View Result Date: 03/23/2023 CLINICAL DATA:  Cough, chest  congestion, shortness of breath EXAM: CHEST - 2 VIEW COMPARISON:  06/15/2020 FINDINGS: Heart and mediastinal contours are within normal limits. No focal opacities or effusions. No acute  bony abnormality. Loop recorder device projects over the mid chest, stable. IMPRESSION: No active cardiopulmonary disease. Electronically Signed   By: Franky Crease M.D.   On: 03/23/2023 12:31   Assessment and Plan :   PDMP not reviewed this encounter.  1. Acute viral syndrome   2. Chest congestion   3. Mild persistent asthma, uncomplicated    COVID and flu testing negative, negative chest x-ray.  Recommended general supportive care for an acute viral syndrome.  Advised that she go ahead and start the prescription for prednisone  that was given to her from her e-visit.  Patient cannot tolerate albuterol.  Counseled patient on potential for adverse effects with medications prescribed/recommended today, ER and return-to-clinic precautions discussed, patient verbalized understanding.    Christopher Savannah, [place] 03/23/23 1550

## 2023-03-23 NOTE — ED Triage Notes (Signed)
 Pt reports body aches, cough and chest pain when coughing x 2 days. Mucinex and Nyquil gives no relief.   Pt did a virtual visit last night prednisone  and Tessalon  sent to the pharmacy, pt has not pick up yet.

## 2023-03-23 NOTE — Discharge Instructions (Addendum)
 I have placed orders to have an x-ray done at the med center in [facility].  Please had there now.  Go through the main hospital and not the emergency room.  Once you are there and let them know that you will came to our clinic and we send she to their facility for an outpatient x-ray.  If no one is at the front desk then they are likely out the rest of the day and at that point you would have to go through the emergency room.  Do not check in as a patient through the emergency room.  Simply let them know that you are there for an outpatient x-ray from our clinic.  I will call you with your results and update our treatment plan if necessary after I get the report.    We will manage this as a viral syndrome. For sore throat or cough try using a honey-based tea. Use 3 teaspoons of honey with juice squeezed from half lemon. Place shaved pieces of ginger into 1/2-1 cup of water and warm over stove top. Then mix the ingredients and repeat every 4 hours as needed. Please take Tylenol  500mg -650mg  once every 6 hours for fevers, aches and pains. Hydrate very well with at least 2 liters (64 ounces) of water. Eat light meals such as soups (chicken and noodles, chicken wild rice, vegetable).  Do not eat any foods that you are allergic to.  Start an antihistamine like Zyrtec (10mg  daily) for postnasal drainage, sinus congestion.  You can take this together with prednisone  and cough syrup.

## 2023-03-29 DIAGNOSIS — F411 Generalized anxiety disorder: Secondary | ICD-10-CM | POA: Diagnosis not present

## 2023-06-23 ENCOUNTER — Encounter: Payer: Self-pay | Admitting: Sports Medicine

## 2023-06-23 ENCOUNTER — Other Ambulatory Visit (INDEPENDENT_AMBULATORY_CARE_PROVIDER_SITE_OTHER): Payer: Self-pay

## 2023-06-23 ENCOUNTER — Ambulatory Visit (INDEPENDENT_AMBULATORY_CARE_PROVIDER_SITE_OTHER): Admitting: Sports Medicine

## 2023-06-23 DIAGNOSIS — M357 Hypermobility syndrome: Secondary | ICD-10-CM

## 2023-06-23 DIAGNOSIS — M545 Low back pain, unspecified: Secondary | ICD-10-CM

## 2023-06-23 DIAGNOSIS — G8929 Other chronic pain: Secondary | ICD-10-CM

## 2023-06-23 DIAGNOSIS — M533 Sacrococcygeal disorders, not elsewhere classified: Secondary | ICD-10-CM | POA: Diagnosis not present

## 2023-06-23 DIAGNOSIS — M25551 Pain in right hip: Secondary | ICD-10-CM | POA: Diagnosis not present

## 2023-06-23 DIAGNOSIS — M25552 Pain in left hip: Secondary | ICD-10-CM | POA: Diagnosis not present

## 2023-06-23 MED ORDER — CELECOXIB 200 MG PO CAPS
200.0000 mg | ORAL_CAPSULE | Freq: Every day | ORAL | 0 refills | Status: DC
Start: 2023-06-23 — End: 2023-12-22

## 2023-06-23 NOTE — Progress Notes (Signed)
 Marilyn Rhodes - 33 y.o. female MRN [id number]  Date of birth: 12-24-1990  Office Visit Note: Visit Date: 06/23/2023 PCP: Pcp, No Referred by: No ref. provider found  Subjective: Chief Complaint  Patient presents with   Lower Back - Pain   HPI: Marilyn Rhodes is a very pleasant 33 y.o. female who presents today for chronic low back pain as well as posterior buttock and bilateral hip pain.  She has had pain for many years.  Believes this started after the birth of her son about 11 years ago.  At the end of her pregnancy she had issues with sciatic nerve irritation and feels like her pain never fully went away.  She does have a notable past medical history of joint hypermobility, POTS, history of SVT with AV node ablation.  She has not formally been diagnosed with Ehlers-Danlos syndrome, but does admit to stretchy skin as well.  She has tried acupuncture, stretching/chiropractor in the past with only some relief.  She is using ibuprofen 800 mg with only mild relief.  Her pain is interfering with her activities of daily living.  The right hip is worse than the left, she does get some groin pain on the right at times as well.  Pertinent ROS were reviewed with the patient and found to be negative unless otherwise specified above in HPI.   Assessment & Plan: Visit Diagnoses:  1. Chronic bilateral low back pain, unspecified whether sciatica present   2. SI (sacroiliac) joint dysfunction   3. Chronic hip pain, bilateral   4. Benign joint hypermobility    Plan: Impression is chronic bilateral low back and hip pain which I believe is multifactorial.  The most significant of her pain is emanating from her SI joints with SI joint dysfunction.  She does have joint hypermobility, discussed with her she likely has Ehlers-Danlos given her hypermobility, POTS, and skin hyperelasticity.  She can consider genetic testing with her primary doctor if would like confirmation.  Her joint hypermobility however  put stress on the posterior SI joint complex, more so the connective tissue and surrounding musculature as opposed to the true joint as she does not have significant OA on x-rays.  Given her joint hypermobility, her SI joint dysfunction and hip stability weakness, I would like to send her to [facility] for formalized physical therapy given her familiarity with these conditions.  We will also start her on Celebrex 200 mg daily for the next 2-3 weeks consistently.  We did discuss the role of considering ultrasound-guided SI joint injections, but I would like to see her improvement in response to the above treatments first.  She will follow-up in the next 4-5 weeks for reevaluation.  Follow-up: Return in about 5 weeks (around 07/28/2023) for f/u 4-5 weeks b/l SI joint (30-min for poss inj).   Meds & Orders:  Meds ordered this encounter  Medications   celecoxib (CELEBREX) 200 MG capsule    Sig: Take 1 capsule (200 mg total) by mouth daily.    Dispense:  30 capsule    Refill:  0    Orders Placed This Encounter  Procedures   XR Lumbar Spine 2-3 Views   XR HIPS BILAT W OR W/O PELVIS 3-4 VIEWS   Ambulatory referral to Physical Therapy     Procedures: No procedures performed      Clinical History: No specialty comments available.  She reports that she has never smoked. She has never used smokeless tobacco. No results for input(s): "HGBA1C", "  LABURIC" in the last 8760 hours.  Objective:    Physical Exam  Gen: Well-appearing, in no acute distress; non-toxic CV: Well-perfused. Warm.  Resp: Breathing unlabored on room air; no wheezing. Psych: Fluid speech in conversation; appropriate affect; normal thought process  Ortho Exam - Lumbar: No specific midline spinous process TTP.  She does have full range of motion with flexion extension with some exacerbation of her pain with endrange flexion.  There is lower lumbar paraspinal hypertonicity but more pain surrounding the SI joint.  Negative  SLR.  - Bilateral hips: No greater trochanter TTP.  There is weakness of the right hip abductors, 4/5 compared to 5/5 of the contralateral hip.  There is reproducible pain with FADIR testing on the right hip, negative on the left.  There is joint laxity with quite excessive motion with external and internal rotation about the hip joints bilaterally.  There is rather significant pain just inferior to the PSIS over the right and left SI joint.  Positive Fortin's point test, positive SI joint compression test.  - Beighton score: 9/9  Imaging: XR HIPS BILAT W OR W/O PELVIS 3-4 VIEWS Result Date: 06/23/2023 2 views of the right and left hip including AP and lateral film were ordered and reviewed by myself.  X-rays demonstrate femoral head well-seated within the acetabulum.  There is no significant joint space narrowing or arthritic change.  No acute fractures or otherwise acute bony abnormality noted.  XR Lumbar Spine 2-3 Views Result Date: 06/23/2023 2 views of the lumbar spine including AP and lateral film were ordered and reviewed by myself.  There is very early DDD at L3-L4 and L4-L5 level.  No significant arthritic change or spurring.  No listhesis.   Past Medical/Family/Surgical/Social History: Medications & Allergies reviewed per EMR, new medications updated. Patient Active Problem List   Diagnosis Date Noted   Irritable bowel syndrome with diarrhea 07/29/2019   Impingement syndrome of left shoulder region 09/01/2018   Paresthesia of hand 06/27/2018   Pain in right hand 06/26/2018   Mild intermittent asthma without complication 10/29/2017   Family history of factor V deficiency 10/29/2017   Family history of DVT 10/29/2017   Essential hypertension 10/27/2017   Implantable loop recorder present 09/12/2017   Hypermobile joints 09/12/2017   Pain in left shoulder 01/20/2017   Chronic bilateral thoracic back pain 01/20/2017   SVT (supraventricular tachycardia) (HCC) 02/03/2015   POTS  (postural orthostatic tachycardia syndrome) 07/03/2013   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)    Benign positional vertigo    Bilateral shoulder pain    Bilateral thoracic back pain    Carpal tunnel syndrome    Cough    Dizziness    Dysmenorrhea    ECG abnormal    Fatigue    Flu    History of degenerative disc disease    Hypercholesteremia    Hypomagnesemia    Iron deficiency    Lower respiratory tract infection    Lumbar back pain    Muscle spasm of back    Myalgia    Nausea    Oligomenorrhea    Palpitations    Pneumonia, bacterial    Pre-diabetes    Right wrist pain    SOB (shortness of breath) on exertion    Vitamin D deficiency    Family History  Problem Relation Age of Onset   Endometriosis Mother  Factor VIII deficiency Father    Past Surgical History:  Procedure Laterality Date   ABLATION     LOOP RECORDER INSERTION     Social History   Occupational History   Not on file  Tobacco Use   Smoking status: Never   Smokeless tobacco: Never  Vaping Use   Vaping status: Never Used  Substance and Sexual Activity   Alcohol use: Not Currently   Drug use: Not Currently   Sexual activity: Not on file

## 2023-06-23 NOTE — Progress Notes (Signed)
 Pt Patient says that she has had pain in the low back for years. She says that her pain has not gotten better or worse over the last couple of years, but is constant. She says that when it gets bad, she takes 800mg  Ibuprofen, but otherwise does not take medication for her pain. She says that she has tried acupuncture and stretching in the past as well. She says that she does have pain that goes into the legs and feeling like the legs will "give out." She does mention that when she was pregnant about 11 years ago, her baby was on her sciatic nerve and those symptoms have gotten progressively worse over the years since then.

## 2023-07-11 ENCOUNTER — Ambulatory Visit: Admitting: Physical Therapy

## 2023-07-11 NOTE — Therapy (Deleted)
 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)    Benign positional vertigo    Bilateral shoulder pain    Bilateral thoracic back pain    Carpal tunnel syndrome    Cough    Dizziness    Dysmenorrhea    ECG abnormal    Fatigue    Flu    History of degenerative disc disease    Hypercholesteremia    Hypomagnesemia    Iron deficiency    Lower respiratory tract infection    Lumbar back pain    Muscle spasm of back    Myalgia    Nausea    Oligomenorrhea    Palpitations    Pneumonia, bacterial    Pre-diabetes    Right wrist pain    SOB (shortness of breath) on exertion    Vitamin D deficiency    Past Surgical History:  Procedure Laterality Date   ABLATION     LOOP RECORDER INSERTION     Patient Active Problem List   Diagnosis Date Noted   Irritable bowel syndrome with diarrhea 07/29/2019   Impingement syndrome of left shoulder region 09/01/2018   Paresthesia of hand 06/27/2018   Pain in right hand 06/26/2018   Mild intermittent asthma without complication 10/29/2017   Family history of factor V deficiency 10/29/2017   Family history of DVT 10/29/2017   Essential hypertension 10/27/2017   Implantable loop recorder present 09/12/2017   Hypermobile joints 09/12/2017   Pain in left shoulder 01/20/2017   Chronic bilateral thoracic back pain 01/20/2017   SVT (supraventricular tachycardia) (HCC) 02/03/2015   POTS (postural orthostatic tachycardia syndrome) 07/03/2013    PCP: none   REFERRING PROVIDER: Shauna Del DO   REFERRING DIAG: M54.50,G89.29 (ICD-10-CM) - Chronic bilateral low back pain, unspecified whether sciatica present M53.3 (ICD-10-CM) - SI (sacroiliac) joint dysfunction M25.551,M25.552,G89.29  (ICD-10-CM) - Chronic hip pain, bilateral 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  ONSET DATE: chronic   SUBJECTIVE:                                                                                                                                                                                           SUBJECTIVE STATEMENT: ***  PERTINENT HISTORY:   Impression is chronic bilateral low back and hip pain which I believe is multifactorial.  The  most significant of her pain is emanating from her SI joints with SI joint dysfunction.  She does have joint hypermobility, discussed with her she likely has Ehlers-Danlos given her hypermobility, POTS, and skin hyperelasticity.  She can consider genetic testing with her primary doctor if would like confirmation.  Her joint hypermobility however put stress on the posterior SI joint complex, more so the connective tissue and surrounding musculature as opposed to the true joint as she does not have significant OA on x-rays.  Given her joint hypermobility, her SI joint dysfunction and hip stability weakness, I would like to send her to [facility] for formalized physical therapy given her familiarity with these conditions.  We will also start her on Celebrex  200 mg daily for the next 2-3 weeks consistently.  We did discuss the role of considering ultrasound-guided SI joint injections, but I would like to see her improvement in response to the above treatments first.  She will follow-up in the next 4-5 weeks for reevaluation.  Marilyn Rhodes is a very pleasant 33 y.o. female who presents today for chronic low back pain as well as posterior buttock and bilateral hip pain.   She has had pain for many years.  Believes this started after the birth of her son about 11 years ago.  At the end of her pregnancy she had issues with sciatic nerve irritation and feels like her  pain never fully went away.   She does have a notable past medical history of joint hypermobility, POTS, history of SVT with AV node ablation.  She has not formally been diagnosed with Ehlers-Danlos syndrome, but does admit to stretchy skin as well.   She has tried acupuncture, stretching/chiropractor in the past with only some relief.  She is using ibuprofen 800 mg with only mild relief.  Her pain is interfering with her activities of daily living.  The right hip is worse than the left, she does get some groin pain on the right at times as well.   Pertinent ROS were reviewed with the patient and found to be negative unless otherwise specified above in HPI.   PAIN:  Are you having pain? {OPRCPAIN:27236}  PRECAUTIONS: {Therapy precautions:24002}  RED FLAGS: {PT Red Flags:29287}   WEIGHT BEARING RESTRICTIONS: {Yes ***/No:24003}  FALLS:  Has patient fallen in last 6 months? {fallsyesno:27318}  LIVING ENVIRONMENT: Lives with: {OPRC lives with:25569::"lives with their family"} Lives in: {Lives in:25570} Stairs: {opstairs:27293} Has following equipment at home: {Assistive devices:23999}  OCCUPATION: ***  PLOF: {PLOF:24004}  PATIENT GOALS: ***  NEXT MD VISIT: ***  OBJECTIVE:  Note: Objective measures were completed at Evaluation unless otherwise noted.  DIAGNOSTIC FINDINGS:  2022 Lumbar CT IMPRESSION: Negative CT of the lumbar spine. No acute or healing fracture. No discrete disc disease or stenosis.  XR 06/23/2023: 2 views of the right and left hip including AP and lateral film were  ordered and reviewed by myself.  X-rays demonstrate femoral head  well-seated within the acetabulum.  There is no significant joint space  narrowing or arthritic change.  No acute fractures or otherwise acute bony  abnormality noted.  XR 06/23/2023: 2 views of the lumbar spine including AP and lateral film were ordered and  reviewed by myself.  There is very early DDD at L3-L4 and L4-L5 level.   No  significant arthritic change or spurring.  No listhesis.  PATIENT SURVEYS:  LEFS  Extreme difficulty/unable (0), Quite a bit of difficulty (1), Moderate difficulty (2), Little difficulty (3), No difficulty (  4) Survey date:    Any of your usual work, housework or school activities   2. Usual hobbies, recreational or sporting activities   3. Getting into/out of the bath   4. Walking between rooms   5. Putting on socks/shoes   6. Squatting    7. Lifting an object, like a bag of groceries from the floor   8. Performing light activities around your home   9. Performing heavy activities around your home   10. Getting into/out of a car   11. Walking 2 blocks   12. Walking 1 mile   13. Going up/down 10 stairs (1 flight)   14. Standing for 1 hour   15.  sitting for 1 hour   16. Running on even ground   17. Running on uneven ground   18. Making sharp turns while running fast   19. Hopping    20. Rolling over in bed   Score total:  ***     COGNITION: Overall cognitive status: Within functional limits for tasks assessed     SENSATION: {sensation:27233}  MUSCLE LENGTH: Hamstrings: Right *** deg; Left *** deg Andy Bannister test: Right *** deg; Left *** deg  POSTURE: {posture:25561}  PALPATION: ***  LUMBAR ROM:   AROM eval  Flexion   Extension   Right lateral flexion   Left lateral flexion   Right rotation   Left rotation    (Blank rows = not tested)  LOWER EXTREMITY ROM:     {AROM/PROM:27142}  Right eval Left eval  Hip flexion    Hip extension    Hip abduction    Hip adduction    Hip internal rotation    Hip external rotation    Knee flexion    Knee extension    Ankle dorsiflexion    Ankle plantarflexion    Ankle inversion    Ankle eversion     (Blank rows = not tested)  LOWER EXTREMITY MMT:    MMT Right eval Left eval  Hip flexion    Hip extension    Hip abduction    Hip adduction    Hip internal rotation    Hip external rotation    Knee flexion     Knee extension    Ankle dorsiflexion    Ankle plantarflexion    Ankle inversion    Ankle eversion     (Blank rows = not tested)  LUMBAR SPECIAL TESTS:  {lumbar special test:25242}  FUNCTIONAL TESTS:  {Functional tests:24029}  GAIT: Distance walked: *** Assistive device utilized: {Assistive devices:23999} Level of assistance: {Levels of assistance:24026} Comments: ***   Major joint involvement/complaints  History of dislocation/subluxations  Other symptoms/fatigue/sleep  What have you tried? What has or has not worked?  Do you know your triggers for pain, instability, other symptoms?  What would success look like, feel like to you?   Specific goals?  Assessment (as on reg. Template) Beighton Scale Lumbar (_/1) Knees (_/2) Elbows (_/2)  5th digit (_2) Thumb (_/2) Comment on hips, shoulders   Education specific to condition  PNE/Explain pain   Joint inflammation/collagen/ligament laxity, muscular support  Stretching vs stabilizing  Posture, alignment, neutral zone and joint protection  Exercise prescription   Referrals for   Orthotics/splinting?  Dermatologist ([facility])  Pelvic floor rehab Functional medicine Social work/support groups/social media Cardiologist (POTS) Neurologist     TREATMENT DATE: ***  PATIENT EDUCATION:  Education details: *** Person educated: {Person educated:25204} Education method: {Education Method:25205} Education comprehension: {Education Comprehension:25206}  HOME EXERCISE PROGRAM: ***  ASSESSMENT:  CLINICAL IMPRESSION: Patient is a *** y.o. *** who was seen today for physical therapy evaluation and treatment for ***.   OBJECTIVE IMPAIRMENTS: {opptimpairments:25111}.   ACTIVITY LIMITATIONS: {activitylimitations:27494}  PARTICIPATION LIMITATIONS:  {participationrestrictions:25113}  PERSONAL FACTORS: {Personal factors:25162} are also affecting patient's functional outcome.   REHAB POTENTIAL: {rehabpotential:25112}  CLINICAL DECISION MAKING: {clinical decision making:25114}  EVALUATION COMPLEXITY: {Evaluation complexity:25115}   GOALS: Goals reviewed with patient? {yes/no:20286}  SHORT TERM GOALS: Target date: ***  *** Baseline: Goal status: INITIAL  2.  *** Baseline:  Goal status: INITIAL  3.  *** Baseline:  Goal status: INITIAL  4.  *** Baseline:  Goal status: INITIAL  5.  *** Baseline:  Goal status: INITIAL  6.  *** Baseline:  Goal status: INITIAL  LONG TERM GOALS: Target date: ***  *** Baseline:  Goal status: INITIAL  2.  *** Baseline:  Goal status: INITIAL  3.  *** Baseline:  Goal status: INITIAL  4.  *** Baseline:  Goal status: INITIAL  5.  *** Baseline:  Goal status: INITIAL  6.  *** Baseline:  Goal status: INITIAL  PLAN:  PT FREQUENCY: {rehab frequency:25116}  PT DURATION: {rehab duration:25117}  PLANNED INTERVENTIONS: {rehab planned interventions:25118::"97110-Therapeutic exercises","97530- Therapeutic [phone]- Neuromuscular re-education","97535- Self TKZS","01093- Manual therapy"}.  PLAN FOR NEXT SESSION: ***   Jawanza Zambito, PT 07/11/2023, 8:01 AM

## 2023-07-20 NOTE — Therapy (Signed)
 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)    Benign positional vertigo    Bilateral shoulder pain    Bilateral thoracic back pain    Carpal tunnel syndrome    Cough    Dizziness    Dysmenorrhea    ECG abnormal    Fatigue    Flu    History of degenerative disc disease    Hypercholesteremia    Hypomagnesemia    Iron deficiency    Lower respiratory tract infection    Lumbar back pain    Muscle spasm of back    Myalgia    Nausea    Oligomenorrhea    Palpitations    Pneumonia, bacterial    Pre-diabetes    Right wrist pain    SOB (shortness of breath) on exertion    Vitamin D deficiency    Past Surgical History:  Procedure Laterality Date   ABLATION     LOOP RECORDER INSERTION     Patient Active Problem List   Diagnosis Date Noted   Irritable bowel syndrome with diarrhea 07/29/2019   Impingement syndrome of left shoulder region 09/01/2018   Paresthesia of hand 06/27/2018   Pain in right hand 06/26/2018   Mild intermittent asthma without complication 10/29/2017   Family history of factor V deficiency 10/29/2017   Family history of DVT 10/29/2017   Essential hypertension 10/27/2017   Implantable loop recorder present 09/12/2017   Hypermobile joints 09/12/2017   Pain in left shoulder 01/20/2017   Chronic bilateral thoracic back pain 01/20/2017   SVT (supraventricular tachycardia) (HCC) 02/03/2015   POTS (postural orthostatic tachycardia syndrome) 07/03/2013    PCP: none   REFERRING PROVIDER: Shauna Del DO   REFERRING DIAG: M54.50,G89.29 (ICD-10-CM) - Chronic bilateral low back pain, unspecified whether sciatica present M53.3 (ICD-10-CM) - SI (sacroiliac) joint dysfunction M25.551,M25.552,G89.29  (ICD-10-CM) - Chronic hip pain, bilateral M35.7 (ICD-10-CM) - Benign joint hypermobility  Rationale for Evaluation and Treatment: Rehabilitation  THERAPY DIAG:  Hypermobility syndrome  Other low back pain  Abnormal posture  ONSET DATE: chronic   SUBJECTIVE:                                                                                                                                                                                           SUBJECTIVE STATEMENT: Pt with chronic pain in her low back and sacrum. She reports a fall  in [employer] on Rt side. This may have been what made it hurt worse. Pain radiates to her Rt thigh and sometime can be the entire leg.  The leg feels numb if I sit too long or stand too long. Rt LE is weaker than lt.  Denies clicking or popping. She does not wear a brace.  She has not fallen. I have POTS syndrome.  I have trouble lying flat (positional vertigo).   Was going to the gym several years ago. I don't lift weights.    PERTINENT HISTORY:    Tore 2 ligaments in her Rt ankle.   MD note:   Impression is chronic bilateral low back and hip pain which I believe is multifactorial.  The most significant of her pain is emanating from her SI joints with SI joint dysfunction.  She does have joint hypermobility, discussed with her she likely has Ehlers-Danlos given her hypermobility, POTS, and skin hyperelasticity.  She can consider genetic testing with her primary doctor if would like confirmation.  Her joint hypermobility however put stress on the posterior SI joint complex, more so the connective tissue and surrounding musculature as opposed to the true joint as she does not have significant OA on x-rays.  Given her joint hypermobility, her SI joint dysfunction and hip stability weakness, I would like to send her to [facility] for formalized physical therapy given her familiarity with these conditions.  We will also start her on Celebrex  200 mg daily for the next  2-3 weeks consistently.  We did discuss the role of considering ultrasound-guided SI joint injections, but I would like to see her improvement in response to the above treatments first.  She will follow-up in the next 4-5 weeks for reevaluation.  [name] is a very pleasant 33 y.o. female who presents today for chronic low back pain as well as posterior buttock and bilateral hip pain.   She has had pain for many years.  Believes this started after the birth of her son about 11 years ago.  At the end of her pregnancy she had issues with sciatic nerve irritation and feels like her pain never fully went away.   She does have a notable past medical history of joint hypermobility, POTS, history of SVT with AV node ablation.  She has not formally been diagnosed with Ehlers-Danlos syndrome, but does admit to stretchy skin as well.   She has tried acupuncture, stretching/chiropractor in the past with only some relief.  She is using ibuprofen 800 mg with only mild relief.  Her pain is interfering with her activities of daily living.  The right hip is worse than the left, she does get some groin pain on the right at times as well.   Pertinent ROS were reviewed with the patient and found to be negative unless otherwise specified above in HPI.   PAIN:  Are you having pain? Yes: NPRS scale: none current , can be 10/10 with activity  Pain location: low back and into sacrum  Pain description: tight, pulling, aching  Aggravating factors: AM, cold, tries to avoid lifting  Relieving factors: after moving around   PRECAUTIONS: None  RED FLAGS: None   WEIGHT BEARING RESTRICTIONS: No  FALLS:  Has patient fallen in last 6 months? No  LIVING ENVIRONMENT: Lives with: lives with their family2 kids (15 and 12)  Lives in: House/apartment Stairs: Yes: External: 3 flights  steps; on right going up Has following equipment at home: None  OCCUPATION: Work time  [employer],   PLOF:  Independent  PATIENT GOALS: I want less pain and I want to get stronger   NEXT MD VISIT: as needed   OBJECTIVE:  Note: Objective measures were completed at Evaluation unless otherwise noted.  DIAGNOSTIC FINDINGS:  2022 Lumbar CT IMPRESSION: Negative CT of the lumbar spine. No acute or healing fracture. No discrete disc disease or stenosis.  XR 06/23/2023: 2 views of the right and left hip including AP and lateral film were  ordered and reviewed by myself.  X-rays demonstrate femoral head  well-seated within the acetabulum.  There is no significant joint space  narrowing or arthritic change.  No acute fractures or otherwise acute bony  abnormality noted.  XR 06/23/2023: 2 views of the lumbar spine including AP and lateral film were ordered and  reviewed by myself.  There is very early DDD at L3-L4 and L4-L5 level.  No  significant arthritic change or spurring.  No listhesis.  PATIENT SURVEYS:   COGNITION: Overall cognitive status: Within functional limits for tasks assessed     SENSATION: WNL   MUSCLE LENGTH: Hamstrings: WNL Thomas test: WNL   POSTURE: L shoulder low , lumbar hyperlordosis  PALPATION: Hypermobile throughout with painful palpation to thoracic and lumbar segments   LUMBAR ROM:   AROM eval  Flexion Palms flat   Extension NT   Right lateral flexion   Left lateral flexion   Right rotation   Left rotation    (Blank rows = not tested)  LOWER EXTREMITY ROM:   Hypermobility throughout   Passive  Right eval Left eval  Hip flexion    Hip extension    Hip abduction    Hip adduction    Hip internal rotation Pain    Hip external rotation    Knee flexion    Knee extension    Ankle dorsiflexion    Ankle plantarflexion    Ankle inversion    Ankle eversion     (Blank rows = not tested)  LOWER EXTREMITY MMT:    MMT Right eval Left eval  Hip flexion 4+ 4+  Hip extension    Hip abduction 4- 4+  Hip adduction    Hip internal rotation    Hip  external rotation    Knee flexion     Knee extension Hyper 10  Hyper 10   Ankle dorsiflexion    Ankle plantarflexion    Ankle inversion    Ankle eversion     (Blank rows = not tested)  LUMBAR SPECIAL TESTS:  Straight leg raise test: Negative and SI Compression/distraction test: painful   FUNCTIONAL TESTS:  NT on eval   GAIT: Distance walked: 150 Assistive device utilized: None Level of assistance: Complete Independence Comments: not observed  Assessment (as on reg. Template) Beighton Scale Lumbar (_1/1) Knees (2_/2) Elbows (_2/2)  5th digit (_2/2) Thumb (2_/2) Comment on hips, shoulders     TREATMENT DATE: [facility] Adult PT Treatment:                                                DATE: 07/21/23 Self Care: HEP, POC and joint stability, preservation  PATIENT EDUCATION:  Education details: see above  Person educated: Patient Education method: Explanation, Demonstration, and Handouts Education comprehension: verbalized understanding, returned demonstration, and needs further education  HOME EXERCISE PROGRAM: Access Code: [email or web address] URL: [email or web address] Date: 07/21/2023 Prepared by: [facility]  Exercises - Supine Transversus Abdominis Bracing - Hands on Stomach  - 1 x daily - 7 x weekly - 2 sets - 10 reps - 10 hold - Supine Transversus Abdominis Bracing with Double Leg Fallout  - 1 x daily - 7 x weekly - 2 sets - 10 reps - Supine Active Straight Leg Raise  - 1 x daily - 7 x weekly - 2 sets - 10 reps - 5 hold - Wall Quarter Squat  - 1 x daily - 7 x weekly - 1 sets - 3 reps - 30 hold  ASSESSMENT:  CLINICAL IMPRESSION: Patient is a 33 y.o. female who was seen today for physical therapy evaluation and treatment for low back pain and hypermobility syndrome. She has significant joint hypermobility in her spine and poor motor  control, proprioception.  She has a good medical team and would like to see if PT can help her get stronger.  She will be having an injection next week.   OBJECTIVE IMPAIRMENTS: decreased coordination, decreased mobility, difficulty walking, decreased ROM, decreased strength, increased fascial restrictions, improper body mechanics, postural dysfunction, pain, and generalized joint hypermobility.   ACTIVITY LIMITATIONS: carrying, lifting, standing, squatting, transfers, and locomotion level  PARTICIPATION LIMITATIONS: cleaning, interpersonal relationship, shopping, community activity, and occupation  PERSONAL FACTORS: 1-2 comorbidities: cardiac/dysautonomia are also affecting patient's functional outcome.   REHAB POTENTIAL: Excellent  CLINICAL DECISION MAKING: Evolving/moderate complexity  EVALUATION COMPLEXITY: Moderate   GOALS: Goals reviewed with patient? Yes LONG TERM GOALS: Target date: 09/04/2023    Patient will be I with concepts of joint protection and stability as it pertains to joint hypermobility.  Baseline:  Goal status: INITIAL  2.  Patient will be able to demonstrate proper posture and lifting techniques related to spine health and reduction of symptoms.   Baseline:  Goal status: INITIAL  3.  Pt will be able to demonstrate and maintain good core stability without increased pain (with supine lower core exercises)  Baseline:  Goal status: INITIAL  4.  Patient will be able to demonstrate Rt hip/knee/ankle strength to 5/5 in order to maximize functional mobility, ambulation and lifting.  Baseline:  Goal status: INITIAL  5.  Patient will be independent with final HEP upon discharge from PT and report consistent benefit following exercise completion.   Baseline:  Goal status: INITIAL  6.  Pt will be able to report rare Rt LE pain (radiating from low back) with ADLs Baseline:  Goal status: INITIAL  PLAN:  PT FREQUENCY: 1-2x/week  PT DURATION: 6 weeks  PLANNED  INTERVENTIONS: 97164- PT Re-evaluation, 97750- Physical Performance Testing, 97110-Therapeutic exercises, 97530- Therapeutic activity, [id number]- Neuromuscular re-education, 97535- Self Care, 54098- Manual therapy, Patient/Family education, Balance training, Taping, Cryotherapy, and Moist heat.Aquatics?  PLAN FOR NEXT SESSION: check HEP and progress core stability, posture    Sharita Bienaime, PT 07/24/2023, 7:39 AM   Marci Setter, PT 07/24/23 7:49 AM Phone: [phone] Fax: [phone]

## 2023-07-21 ENCOUNTER — Ambulatory Visit: Attending: Sports Medicine | Admitting: Physical Therapy

## 2023-07-21 DIAGNOSIS — G8929 Other chronic pain: Secondary | ICD-10-CM | POA: Insufficient documentation

## 2023-07-21 DIAGNOSIS — M25551 Pain in right hip: Secondary | ICD-10-CM | POA: Diagnosis not present

## 2023-07-21 DIAGNOSIS — M533 Sacrococcygeal disorders, not elsewhere classified: Secondary | ICD-10-CM | POA: Insufficient documentation

## 2023-07-21 DIAGNOSIS — M25552 Pain in left hip: Secondary | ICD-10-CM | POA: Insufficient documentation

## 2023-07-21 DIAGNOSIS — R293 Abnormal posture: Secondary | ICD-10-CM | POA: Diagnosis not present

## 2023-07-21 DIAGNOSIS — M545 Low back pain, unspecified: Secondary | ICD-10-CM | POA: Diagnosis not present

## 2023-07-21 DIAGNOSIS — M5459 Other low back pain: Secondary | ICD-10-CM | POA: Diagnosis not present

## 2023-07-21 DIAGNOSIS — M357 Hypermobility syndrome: Secondary | ICD-10-CM | POA: Diagnosis not present

## 2023-07-28 ENCOUNTER — Encounter: Payer: Self-pay | Admitting: Sports Medicine

## 2023-07-28 ENCOUNTER — Other Ambulatory Visit: Payer: Self-pay

## 2023-07-28 ENCOUNTER — Ambulatory Visit (INDEPENDENT_AMBULATORY_CARE_PROVIDER_SITE_OTHER): Admitting: Sports Medicine

## 2023-07-28 DIAGNOSIS — M7918 Myalgia, other site: Secondary | ICD-10-CM

## 2023-07-28 DIAGNOSIS — G8929 Other chronic pain: Secondary | ICD-10-CM

## 2023-07-28 DIAGNOSIS — M357 Hypermobility syndrome: Secondary | ICD-10-CM | POA: Diagnosis not present

## 2023-07-28 DIAGNOSIS — M25552 Pain in left hip: Secondary | ICD-10-CM | POA: Diagnosis not present

## 2023-07-28 DIAGNOSIS — M25551 Pain in right hip: Secondary | ICD-10-CM

## 2023-07-28 DIAGNOSIS — M533 Sacrococcygeal disorders, not elsewhere classified: Secondary | ICD-10-CM

## 2023-07-28 NOTE — Progress Notes (Signed)
 Marilyn Rhodes - 33 y.o. female MRN [id number]  Date of birth: 1990-06-13  Office Visit Note: Visit Date: 07/28/2023 PCP: Pcp, No Referred by: No ref. provider found  Subjective: Chief Complaint  Patient presents with   Lower Back - Follow-up   HPI: Marilyn Rhodes is a pleasant 33 y.o. female who presents today for follow-up of bilateral low back/SI-joint pain, R > L.  Hanifa still continues with pain about the posterior hips and around the SI joint region.  Her right side is the most significant, her left side is less bothersome.  She did take the Celebrex  200 mg daily and she does notice this takes away the edge of her pain but does not resolve completely.  It took some time to get into physical therapy with [facility], but she did have her initial evaluation and she gave her some stretching and exercises to do which she is starting to find helpful.  She has a few additional appointments upcoming.  She is interested in proceeding with an injection to give her onset of relief as she progresses through PT.  Pertinent ROS were reviewed with the patient and found to be negative unless otherwise specified above in HPI.   Assessment & Plan: Visit Diagnoses:  1. SI (sacroiliac) joint dysfunction   2. Chronic hip pain, bilateral   3. Right buttock pain   4. Benign joint hypermobility    Plan: Impression is chronic bilateral low back and posterior hip pain emanating mainly from the SI joints. 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 her right side is most symptomatic and is causing pain daily and with ADLs.  Through shared decision making, we did proceed with ultrasound-guided right SI joint injection, patient tolerated well.  Advised on postinjection protocol.  After 48 hours, she may return to regular activity as well as her PT exercises.  She will continue a few additional PT sessions with [facility] and I'd like to see her progress improvement from  this now status postinjection.  She is noticing some improvement with Celebrex  200 mg daily, she may use this daily as needed.  I would like to see her back in about 1 month to reevaluate.  Hopefully her contralateral left side settles down as well with above, if not we could always consider an injection in this location only if needed.  Follow-up in 1 month.  Follow-up: Return in about 1 month (around 08/27/2023) for For B/l SI joints (30-min for consider inj).   Meds & Orders: No orders of the defined types were placed in this encounter.   Orders Placed This Encounter  Procedures   US  Guided Needle Placement - No Linked Charges     Procedures: U/S-guided SI-joint injection, Right   After discussion of risk/benefits/indications, informed verbal consent was obtained. A timeout was then performed. The patient was positioned in a prone position on exam room table with a pillow placed under the pelvis for mild hip flexion. The SI joint area was cleaned and prepped with betadine and alcohol swabs. Sterile ultrasound gel was applied and the ultrasound transducer was placed in an anatomic axial plane over the PSIS, then moved distally over the SI-joint. Using ultrasound guidance, a 22-gauge, 3.5 needle was inserted from a medial to lateral approach utilizing an in-plane approach and directed into the SI-joint. The SI-joint was then injected with a mixture of 4:2 lidocaine:depomedrol with visualization of the injectate flow into the SI-joint under ultrasound visualization. The patient  tolerated the procedure well without immediate complications.       Clinical History: No specialty comments available.  She reports that she has never smoked. She has never used smokeless tobacco. No results for input(s): HGBA1C, LABURIC in the last 8760 hours.  Objective:    Physical Exam  Gen: Well-appearing, in no acute distress; non-toxic CV: Well-perfused. Warm.  Resp: Breathing unlabored on room air; no  wheezing. Psych: Fluid speech in conversation; appropriate affect; normal thought process  Ortho Exam - Lumbar/SI-joints: Positive TTP on the right > left SI joint region just inferior to the PSIS. + Fortin's point test, positive SI joint compression test.  Imaging: No results found.  Past Medical/Family/Surgical/Social History: Medications & Allergies reviewed per EMR, new medications updated. Patient Active Problem List   Diagnosis Date Noted   Irritable bowel syndrome with diarrhea 07/29/2019   Impingement syndrome of left shoulder region 09/01/2018   Paresthesia of hand 06/27/2018   Pain in right hand 06/26/2018   Mild intermittent asthma without complication 10/29/2017   Family history of factor V deficiency 10/29/2017   Family history of DVT 10/29/2017   Essential hypertension 10/27/2017   Implantable loop recorder present 09/12/2017   Hypermobile joints 09/12/2017   Pain in left shoulder 01/20/2017   Chronic bilateral thoracic back pain 01/20/2017   SVT (supraventricular tachycardia) (HCC) 02/03/2015   POTS (postural orthostatic tachycardia syndrome) 07/03/2013   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)    Benign positional vertigo    Bilateral shoulder pain    Bilateral thoracic back pain    Carpal tunnel syndrome    Cough    Dizziness    Dysmenorrhea    ECG abnormal    Fatigue    Flu    History of degenerative disc disease    Hypercholesteremia    Hypomagnesemia    Iron deficiency    Lower respiratory tract infection    Lumbar back pain    Muscle spasm of back    Myalgia    Nausea    Oligomenorrhea    Palpitations    Pneumonia, bacterial    Pre-diabetes    Right wrist pain    SOB (shortness of breath) on exertion    Vitamin D deficiency    Family History  Problem Relation Age of Onset   Endometriosis Mother    Factor VIII deficiency Father    Past Surgical  History:  Procedure Laterality Date   ABLATION     LOOP RECORDER INSERTION     Social History   Occupational History   Not on file  Tobacco Use   Smoking status: Never   Smokeless tobacco: Never  Vaping Use   Vaping status: Never Used  Substance and Sexual Activity   Alcohol use: Not Currently   Drug use: Not Currently   Sexual activity: Not on file

## 2023-07-28 NOTE — Progress Notes (Signed)
 Patient says that she has not noticed much improvement, but does think she is moving in the right direction towards improvement. She did take the Celebrex . She has been to one appointment for physical therapy where they gave her stretches and exercises to do at home, although she says it has only been a couple of days. She does have future appointments scheduled at physical therapy.

## 2023-08-01 ENCOUNTER — Encounter: Payer: Self-pay | Admitting: Physical Therapy

## 2023-08-01 ENCOUNTER — Ambulatory Visit: Admitting: Physical Therapy

## 2023-08-01 DIAGNOSIS — M5459 Other low back pain: Secondary | ICD-10-CM

## 2023-08-01 DIAGNOSIS — R293 Abnormal posture: Secondary | ICD-10-CM | POA: Diagnosis not present

## 2023-08-01 DIAGNOSIS — M357 Hypermobility syndrome: Secondary | ICD-10-CM | POA: Diagnosis not present

## 2023-08-01 DIAGNOSIS — M25551 Pain in right hip: Secondary | ICD-10-CM | POA: Diagnosis not present

## 2023-08-01 DIAGNOSIS — M545 Low back pain, unspecified: Secondary | ICD-10-CM | POA: Diagnosis not present

## 2023-08-01 DIAGNOSIS — G8929 Other chronic pain: Secondary | ICD-10-CM | POA: Diagnosis not present

## 2023-08-01 DIAGNOSIS — M25552 Pain in left hip: Secondary | ICD-10-CM | POA: Diagnosis not present

## 2023-08-01 DIAGNOSIS — M533 Sacrococcygeal disorders, not elsewhere classified: Secondary | ICD-10-CM | POA: Diagnosis not present

## 2023-08-01 NOTE — Therapy (Addendum)
 OUTPATIENT PHYSICAL THERAPY THORACOLUMBAR TREATMENT DISCHARGE   Patient Name: Marilyn Rhodes MRN: [id number] DOB:Jan 11, 1991, 33 y.o., female Today's Date: 08/01/2023    PHYSICAL THERAPY DISCHARGE SUMMARY  Visits from Start of Care: 2  Current functional level related to goals / functional outcomes: See below    Remaining deficits: See below   Education / Equipment: HEP   Patient agrees to discharge. Patient goals were not met. Patient is being discharged due to not returning since the last visit.   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)    Benign positional vertigo    Bilateral shoulder pain    Bilateral thoracic back pain    Carpal tunnel syndrome    Cough    Dizziness    Dysmenorrhea    ECG abnormal    Fatigue    Flu    History of degenerative disc disease    Hypercholesteremia    Hypomagnesemia    Iron deficiency    Lower respiratory tract infection    Lumbar back pain    Muscle spasm of back    Myalgia    Nausea    Oligomenorrhea    Palpitations    Pneumonia, bacterial    Pre-diabetes    Right wrist pain    SOB (shortness of breath) on exertion    Vitamin D  deficiency    Past Surgical History:  Procedure Laterality Date   ABLATION     LOOP RECORDER INSERTION     Patient Active Problem List   Diagnosis Date Noted   Irritable bowel syndrome with diarrhea 07/29/2019   Impingement syndrome of left shoulder region 09/01/2018   Paresthesia of hand 06/27/2018   Pain in right hand 06/26/2018   Mild intermittent asthma without complication 10/29/2017    Family history of factor V deficiency 10/29/2017   Family history of DVT 10/29/2017   Essential hypertension 10/27/2017   Implantable loop recorder present 09/12/2017   Hypermobile joints 09/12/2017   Pain in left shoulder 01/20/2017   Chronic bilateral thoracic back pain 01/20/2017   SVT (supraventricular tachycardia) (HCC) 02/03/2015   POTS (postural orthostatic tachycardia syndrome) 07/03/2013    PCP: none   REFERRING PROVIDER: Burnetta Brunet DO   REFERRING DIAG: M54.50,G89.29 (ICD-10-CM) - Chronic bilateral low back pain, unspecified whether sciatica present M53.3 (ICD-10-CM) - SI (sacroiliac) joint dysfunction M25.551,M25.552,G89.29 (ICD-10-CM) - Chronic hip pain, bilateral M35.7 (ICD-10-CM) - Benign joint hypermobility  Rationale for Evaluation and Treatment: Rehabilitation  THERAPY DIAG:  Hypermobility syndrome  Other low back pain  Abnormal posture  ONSET DATE: chronic   SUBJECTIVE:  SUBJECTIVE STATEMENT: I had the SI injection 4 days ago and had immediate relief and improved sleep. I feel stiff but not pain. I have not started HEP, I wanted to wait until I got here.   EVAL: Pt with chronic pain in her low back and sacrum. She reports a fall in [employer] on Rt side. This may have been what made it hurt worse. Pain radiates to her Rt thigh and sometime can be the entire leg.  The leg feels numb if I sit too long or stand too long. Rt LE is weaker than lt.  Denies clicking or popping. She does not wear a brace.  She has not fallen. I have POTS syndrome.  I have trouble lying flat (positional vertigo).   Was going to the gym several years ago. I don't lift weights.    PERTINENT HISTORY:    Tore 2 ligaments in her Rt ankle.   MD note:   Impression is chronic bilateral low back and hip pain  which I believe is multifactorial.  The most significant of her pain is emanating from her SI joints with SI joint dysfunction.  She does have joint hypermobility, discussed with her she likely has Ehlers-Danlos given her hypermobility, POTS, and skin hyperelasticity.  She can consider genetic testing with her primary doctor if would like confirmation.  Her joint hypermobility however put stress on the posterior SI joint complex, more so the connective tissue and surrounding musculature as opposed to the true joint as she does not have significant OA on x-rays.  Given her joint hypermobility, her SI joint dysfunction and hip stability weakness, I would like to send her to [facility] for formalized physical therapy given her familiarity with these conditions.  We will also start her on Celebrex  200 mg daily for the next 2-3 weeks consistently.  We did discuss the role of considering ultrasound-guided SI joint injections, but I would like to see her improvement in response to the above treatments first.  She will follow-up in the next 4-5 weeks for reevaluation.  Marilyn Rhodes is a very pleasant 33 y.o. female who presents today for chronic low back pain as well as posterior buttock and bilateral hip pain.   She has had pain for many years.  Believes this started after the birth of her son about 11 years ago.  At the end of her pregnancy she had issues with sciatic nerve irritation and feels like her pain never fully went away.   She does have a notable past medical history of joint hypermobility, POTS, history of SVT with AV node ablation.  She has not formally been diagnosed with Ehlers-Danlos syndrome, but does admit to stretchy skin as well.   She has tried acupuncture, stretching/chiropractor in the past with only some relief.  She is using ibuprofen 800 mg with only mild relief.  Her pain is interfering with her activities of daily living.  The right hip is worse than the left, she does get some groin  pain on the right at times as well.   Pertinent ROS were reviewed with the patient and found to be negative unless otherwise specified above in HPI.   PAIN:  Are you having pain? Yes: NPRS scale: none current , can be 10/10 with activity  Pain location: low back and into sacrum  Pain description: tight, pulling, aching  Aggravating factors: AM, cold, tries to avoid lifting  Relieving factors: after moving around   PRECAUTIONS: None  RED FLAGS: None  WEIGHT BEARING RESTRICTIONS: No  FALLS:  Has patient fallen in last 6 months? No  LIVING ENVIRONMENT: Lives with: lives with their family2 kids (15 and 75)  Lives in: House/apartment Stairs: Yes: External: 3 flights  steps; on right going up Has following equipment at home: None  OCCUPATION: Work time Development worker, community,   PLOF: Independent  PATIENT GOALS: I want less pain and I want to get stronger   NEXT MD VISIT: as needed   OBJECTIVE:  Note: Objective measures were completed at Evaluation unless otherwise noted.  DIAGNOSTIC FINDINGS:  2022 Lumbar CT IMPRESSION: Negative CT of the lumbar spine. No acute or healing fracture. No discrete disc disease or stenosis.  XR 06/23/2023: 2 views of the right and left hip including AP and lateral film were  ordered and reviewed by myself.  X-rays demonstrate femoral head  well-seated within the acetabulum.  There is no significant joint space  narrowing or arthritic change.  No acute fractures or otherwise acute bony  abnormality noted.  XR 06/23/2023: 2 views of the lumbar spine including AP and lateral film were ordered and  reviewed by myself.  There is very early DDD at L3-L4 and L4-L5 level.  No  significant arthritic change or spurring.  No listhesis.  PATIENT SURVEYS:   COGNITION: Overall cognitive status: Within functional limits for tasks assessed     SENSATION: WNL   MUSCLE LENGTH: Hamstrings: WNL Thomas test: WNL   POSTURE: L shoulder low , lumbar  hyperlordosis  PALPATION: Hypermobile throughout with painful palpation to thoracic and lumbar segments   LUMBAR ROM:   AROM eval  Flexion Palms flat   Extension NT   Right lateral flexion   Left lateral flexion   Right rotation   Left rotation    (Blank rows = not tested)  LOWER EXTREMITY ROM:   Hypermobility throughout   Passive  Right eval Left eval  Hip flexion    Hip extension    Hip abduction    Hip adduction    Hip internal rotation Pain    Hip external rotation    Knee flexion    Knee extension    Ankle dorsiflexion    Ankle plantarflexion    Ankle inversion    Ankle eversion     (Blank rows = not tested)  LOWER EXTREMITY MMT:    MMT Right eval Left eval  Hip flexion 4+ 4+  Hip extension    Hip abduction 4- 4+  Hip adduction    Hip internal rotation    Hip external rotation    Knee flexion     Knee extension Hyper 10  Hyper 10   Ankle dorsiflexion    Ankle plantarflexion    Ankle inversion    Ankle eversion     (Blank rows = not tested)  LUMBAR SPECIAL TESTS:  Straight leg raise test: Negative and SI Compression/distraction test: painful   FUNCTIONAL TESTS:  NT on eval   GAIT: Distance walked: 150 Assistive device utilized: None Level of assistance: Complete Independence Comments: not observed  Assessment (as on reg. Template) Beighton Scale Lumbar (_1/1) Knees (2_/2) Elbows (_2/2)  5th digit (_2/2) Thumb (2_/2) Comment on hips, shoulders     TREATMENT DATE:  [facility] Adult PT Treatment:  DATE: 08/01/23 Therapeutic Exercise: Piriformis stretch DKTC Gluteal stretch Neuro re-education:  Hook lying with wedge due to positional vertigo Tra bracing  Tra bracing with bent knee fall out Tra Bracing with March Tra Bracing SLR x 5 each  Mini wall sit focusing on posterior pelvic tilt- added education on cervical alignment and add band pulls maintaining chin tuck and PPT Red band 5 x 2       [facility] Adult PT Treatment:                                                DATE: 07/21/23 Self Care: HEP, POC and joint stability, preservation                                                                                                                                   PATIENT EDUCATION:  Education details: see above  Person educated: Patient Education method: Explanation, Demonstration, and Handouts Education comprehension: verbalized understanding, returned demonstration, and needs further education  HOME EXERCISE PROGRAM: Access Code: [email or web address] URL: [email or web address] Date: 07/21/2023 Prepared by: Delon Norma  Exercises - Supine Transversus Abdominis Bracing - Hands on Stomach  - 1 x daily - 7 x weekly - 2 sets - 10 reps - 10 hold - Supine Transversus Abdominis Bracing with Double Leg Fallout  - 1 x daily - 7 x weekly - 2 sets - 10 reps - Supine Active Straight Leg Raise  - 1 x daily - 7 x weekly - 2 sets - 10 reps - 5 hold - Wall Quarter Squat  - 1 x daily - 7 x weekly - 1 sets - 3 reps - 30 hold- added UE band pull 08/01/23  ASSESSMENT:  CLINICAL IMPRESSION: 08/01/23: Pt reports significant improvement in pain following right SI injection 4 days ago. Has not started HEP. Session today focused on neur re-education of transverse abdominals to core to find and maintain neutral spine and posterior pelvic tilt. She requires a wedge in supine due to positional vertigo but was able to follow vervbal cues to maintain neutral lumbar spine and perform low level LE movements while stabilizing. Reviewed wall sit and introduced neck alignment using wall, added band pull for dynamic stability. Will plan to begin quadruped next session if doing well.    EVAL: Patient is a 33 y.o. female who was seen today for physical therapy evaluation and treatment for low back pain and hypermobility syndrome. She has significant joint hypermobility in her spine and poor motor control,  proprioception.  She has a good medical team and would like to see if PT can help her get stronger.  She will be having an injection next week.   OBJECTIVE IMPAIRMENTS: decreased coordination, decreased mobility, difficulty walking, decreased ROM, decreased strength, increased fascial restrictions, improper body mechanics, postural  dysfunction, pain, and generalized joint hypermobility.   ACTIVITY LIMITATIONS: carrying, lifting, standing, squatting, transfers, and locomotion level  PARTICIPATION LIMITATIONS: cleaning, interpersonal relationship, shopping, community activity, and occupation  PERSONAL FACTORS: 1-2 comorbidities: cardiac/dysautonomia are also affecting patient's functional outcome.   REHAB POTENTIAL: Excellent  CLINICAL DECISION MAKING: Evolving/moderate complexity  EVALUATION COMPLEXITY: Moderate   GOALS: Goals reviewed with patient? Yes LONG TERM GOALS: Target date: 09/04/2023    Patient will be I with concepts of joint protection and stability as it pertains to joint hypermobility.  Baseline:  Goal status: INITIAL  2.  Patient will be able to demonstrate proper posture and lifting techniques related to spine health and reduction of symptoms.   Baseline:  Goal status: INITIAL  3.  Pt will be able to demonstrate and maintain good core stability without increased pain (with supine lower core exercises)  Baseline:  Goal status: INITIAL  4.  Patient will be able to demonstrate Rt hip/knee/ankle strength to 5/5 in order to maximize functional mobility, ambulation and lifting.  Baseline:  Goal status: INITIAL  5.  Patient will be independent with final HEP upon discharge from PT and report consistent benefit following exercise completion.   Baseline:  Goal status: INITIAL  6.  Pt will be able to report rare Rt LE pain (radiating from low back) with ADLs  Baseline:  Goal status: INITIAL  PLAN:  PT FREQUENCY: 1-2x/week  PT DURATION: 6 weeks  PLANNED  INTERVENTIONS: 97164- PT Re-evaluation, 97750- Physical Performance Testing, 97110-Therapeutic exercises, 97530- Therapeutic activity, [id number]- Neuromuscular re-education, 97535- Self Care, [id number]- Manual therapy, Patient/Family education, Balance training, Taping, Cryotherapy, and Moist heat.Aquatics?  PLAN FOR NEXT SESSION: check HEP and progress core stability, posture    Harlene Persons, PTA 08/01/23 9:36 AM Phone: [phone] Fax: [phone]

## 2023-08-07 ENCOUNTER — Ambulatory Visit: Admitting: Physical Therapy

## 2023-08-14 ENCOUNTER — Ambulatory Visit: Admitting: Nurse Practitioner

## 2023-08-14 ENCOUNTER — Ambulatory Visit: Admitting: Physical Therapy

## 2023-08-14 ENCOUNTER — Telehealth: Payer: Self-pay | Admitting: Physical Therapy

## 2023-08-14 NOTE — Therapy (Deleted)
 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)    Benign positional vertigo    Bilateral shoulder pain    Bilateral thoracic back pain    Carpal tunnel syndrome    Cough    Dizziness    Dysmenorrhea    ECG abnormal    Fatigue    Flu    History of degenerative disc disease    Hypercholesteremia    Hypomagnesemia    Iron deficiency    Lower respiratory tract infection    Lumbar back pain    Muscle spasm of back    Myalgia    Nausea    Oligomenorrhea    Palpitations    Pneumonia, bacterial    Pre-diabetes    Right wrist pain    SOB (shortness of breath) on exertion    Vitamin D deficiency    Past Surgical History:  Procedure Laterality Date   ABLATION     LOOP RECORDER INSERTION     Patient Active Problem List   Diagnosis Date Noted   Irritable bowel syndrome with diarrhea 07/29/2019   Impingement syndrome of left shoulder region 09/01/2018   Paresthesia of hand 06/27/2018   Pain in right hand 06/26/2018   Mild intermittent asthma without complication 10/29/2017   Family history of factor V deficiency 10/29/2017   Family history of DVT 10/29/2017   Essential hypertension 10/27/2017   Implantable loop recorder present 09/12/2017   Hypermobile joints 09/12/2017   Pain in left shoulder 01/20/2017   Chronic bilateral thoracic back pain 01/20/2017   SVT (supraventricular tachycardia) (HCC) 02/03/2015   POTS (postural orthostatic tachycardia syndrome) 07/03/2013    PCP: none   REFERRING PROVIDER: Burnetta Brunet DO   REFERRING DIAG: M54.50,G89.29 (ICD-10-CM) - Chronic bilateral low back pain, unspecified whether sciatica present M53.3 (ICD-10-CM) - SI (sacroiliac) joint dysfunction M25.551,M25.552,G89.29  (ICD-10-CM) - Chronic hip pain, bilateral 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  ONSET DATE: chronic   SUBJECTIVE:                                                                                                                                                                                           SUBJECTIVE STATEMENT: I had the SI injection 4 days ago and had immediate relief and improved sleep. I feel stiff but not  pain. I have not started HEP, I wanted to wait until I got here.   EVAL: Pt with chronic pain in her low back and sacrum. She reports a fall in [employer] on Rt side. This may have been what made it hurt worse. Pain radiates to her Rt thigh and sometime can be the entire leg.  The leg feels numb if I sit too long or stand too long. Rt LE is weaker than lt.  Denies clicking or popping. She does not wear a brace.  She has not fallen. I have POTS syndrome.  I have trouble lying flat (positional vertigo).   Was going to the gym several years ago. I don't lift weights.    PERTINENT HISTORY:    Tore 2 ligaments in her Rt ankle.   MD note:   Impression is chronic bilateral low back and hip pain which I believe is multifactorial.  The most significant of her pain is emanating from her SI joints with SI joint dysfunction.  She does have joint hypermobility, discussed with her she likely has Ehlers-Danlos given her hypermobility, POTS, and skin hyperelasticity.  She can consider genetic testing with her primary doctor if would like confirmation.  Her joint hypermobility however put stress on the posterior SI joint complex, more so the connective tissue and surrounding musculature as opposed to the true joint as she does not have significant OA on x-rays.  Given her joint hypermobility, her SI joint dysfunction and hip stability weakness, I would like to send her to [facility] for  formalized physical therapy given her familiarity with these conditions.  We will also start her on Celebrex  200 mg daily for the next 2-3 weeks consistently.  We did discuss the role of considering ultrasound-guided SI joint injections, but I would like to see her improvement in response to the above treatments first.  She will follow-up in the next 4-5 weeks for reevaluation.  Priscille Strieter is a very pleasant 33 y.o. female who presents today for chronic low back pain as well as posterior buttock and bilateral hip pain.   She has had pain for many years.  Believes this started after the birth of her son about 11 years ago.  At the end of her pregnancy she had issues with sciatic nerve irritation and feels like her pain never fully went away.   She does have a notable past medical history of joint hypermobility, POTS, history of SVT with AV node ablation.  She has not formally been diagnosed with Ehlers-Danlos syndrome, but does admit to stretchy skin as well.   She has tried acupuncture, stretching/chiropractor in the past with only some relief.  She is using ibuprofen 800 mg with only mild relief.  Her pain is interfering with her activities of daily living.  The right hip is worse than the left, she does get some groin pain on the right at times as well.   Pertinent ROS were reviewed with the patient and found to be negative unless otherwise specified above in HPI.   PAIN:  Are you having pain? Yes: NPRS scale: none current , can be 10/10 with activity  Pain location: low back and into sacrum  Pain description: tight, pulling, aching  Aggravating factors: AM, cold, tries to avoid lifting  Relieving factors: after moving around   PRECAUTIONS: None  RED FLAGS: None   WEIGHT BEARING RESTRICTIONS: No  FALLS:  Has patient fallen in last 6 months? No  LIVING ENVIRONMENT: Lives with: lives with  their family2 kids (15 and 26)  Lives in: House/apartment Stairs: Yes: External: 3 flights   steps; on right going up Has following equipment at home: None  OCCUPATION: Work time Development worker, community,   PLOF: Independent  PATIENT GOALS: I want less pain and I want to get stronger   NEXT MD VISIT: as needed   OBJECTIVE:  Note: Objective measures were completed at Evaluation unless otherwise noted.  DIAGNOSTIC FINDINGS:  2022 Lumbar CT IMPRESSION: Negative CT of the lumbar spine. No acute or healing fracture. No discrete disc disease or stenosis.  XR 06/23/2023: 2 views of the right and left hip including AP and lateral film were  ordered and reviewed by myself.  X-rays demonstrate femoral head  well-seated within the acetabulum.  There is no significant joint space  narrowing or arthritic change.  No acute fractures or otherwise acute bony  abnormality noted.  XR 06/23/2023: 2 views of the lumbar spine including AP and lateral film were ordered and  reviewed by myself.  There is very early DDD at L3-L4 and L4-L5 level.  No  significant arthritic change or spurring.  No listhesis.  PATIENT SURVEYS:   COGNITION: Overall cognitive status: Within functional limits for tasks assessed     SENSATION: WNL   MUSCLE LENGTH: Hamstrings: WNL Thomas test: WNL   POSTURE: L shoulder low , lumbar hyperlordosis  PALPATION: Hypermobile throughout with painful palpation to thoracic and lumbar segments   LUMBAR ROM:   AROM eval  Flexion Palms flat   Extension NT   Right lateral flexion   Left lateral flexion   Right rotation   Left rotation    (Blank rows = not tested)  LOWER EXTREMITY ROM:   Hypermobility throughout   Passive  Right eval Left eval  Hip flexion    Hip extension    Hip abduction    Hip adduction    Hip internal rotation Pain    Hip external rotation    Knee flexion    Knee extension    Ankle dorsiflexion    Ankle plantarflexion    Ankle inversion    Ankle eversion     (Blank rows = not tested)  LOWER EXTREMITY MMT:    MMT Right eval  Left eval  Hip flexion 4+ 4+  Hip extension    Hip abduction 4- 4+  Hip adduction    Hip internal rotation    Hip external rotation    Knee flexion     Knee extension Hyper 10  Hyper 10   Ankle dorsiflexion    Ankle plantarflexion    Ankle inversion    Ankle eversion     (Blank rows = not tested)  LUMBAR SPECIAL TESTS:  Straight leg raise test: Negative and SI Compression/distraction test: painful   FUNCTIONAL TESTS:  NT on eval   GAIT: Distance walked: 150 Assistive device utilized: None Level of assistance: Complete Independence Comments: not observed  Assessment (as on reg. Template) Beighton Scale Lumbar (_1/1) Knees (2_/2) Elbows (_2/2)  5th digit (_2/2) Thumb (2_/2) Comment on hips, shoulders     TREATMENT DATE:   [facility] Adult PT Treatment:                                                DATE: 08/14/23 Therapeutic Exercise: *** Manual Therapy: *** Neuromuscular re-ed: *** Therapeutic Activity: ***  Modalities: *** Self Care: ***  /[facility] Adult PT Treatment:                                                DATE: 08/01/23 Therapeutic Exercise: Piriformis stretch DKTC Gluteal stretch Neuro re-education:  Hook lying with wedge due to positional vertigo Tra bracing  Tra bracing with bent knee fall out Tra Bracing with March Tra Bracing SLR x 5 each  Mini wall sit focusing on posterior pelvic tilt- added education on cervical alignment and add band pulls maintaining chin tuck and PPT Red band 5 x 2      [facility] Adult PT Treatment:                                                DATE: 07/21/23 Self Care: HEP, POC and joint stability, preservation                                                                                                                                   PATIENT EDUCATION:  Education details: see above  Person educated: Patient Education method: Explanation, Demonstration, and Handouts Education comprehension: verbalized  understanding, returned demonstration, and needs further education  HOME EXERCISE PROGRAM: Access Code: [email or web address] URL: [email or web address] Date: 07/21/2023 Prepared by: [facility]  Exercises - Supine Transversus Abdominis Bracing - Hands on Stomach  - 1 x daily - 7 x weekly - 2 sets - 10 reps - 10 hold - Supine Transversus Abdominis Bracing with Double Leg Fallout  - 1 x daily - 7 x weekly - 2 sets - 10 reps - Supine Active Straight Leg Raise  - 1 x daily - 7 x weekly - 2 sets - 10 reps - 5 hold - Wall Quarter Squat  - 1 x daily - 7 x weekly - 1 sets - 3 reps - 30 hold- added UE band pull 08/01/23  ASSESSMENT:  CLINICAL IMPRESSION: 08/01/23: Pt reports significant improvement in pain following right SI injection 4 days ago. Has not started HEP. Session today focused on neur re-education of transverse abdominals to core to find and maintain neutral spine and posterior pelvic tilt. She requires a wedge in supine due to positional vertigo but was able to follow vervbal cues to maintain neutral lumbar spine and perform low level LE movements while stabilizing. Reviewed wall sit and introduced neck alignment using wall, added band pull for dynamic stability. Will plan to begin quadruped next session if doing well.    EVAL: Patient is a 33 y.o. female who was seen today for physical therapy evaluation and treatment for low back pain and hypermobility syndrome. She has significant joint hypermobility in  her spine and poor motor control, proprioception.  She has a good medical team and would like to see if PT can help her get stronger.  She will be having an injection next week.   OBJECTIVE IMPAIRMENTS: decreased coordination, decreased mobility, difficulty walking, decreased ROM, decreased strength, increased fascial restrictions, improper body mechanics, postural dysfunction, pain, and generalized joint hypermobility.   ACTIVITY LIMITATIONS: carrying, lifting, standing, squatting,  transfers, and locomotion level  PARTICIPATION LIMITATIONS: cleaning, interpersonal relationship, shopping, community activity, and occupation  PERSONAL FACTORS: 1-2 comorbidities: cardiac/dysautonomia are also affecting patient's functional outcome.   REHAB POTENTIAL: Excellent  CLINICAL DECISION MAKING: Evolving/moderate complexity  EVALUATION COMPLEXITY: Moderate   GOALS: Goals reviewed with patient? Yes LONG TERM GOALS: Target date: 09/04/2023    Patient will be I with concepts of joint protection and stability as it pertains to joint hypermobility.  Baseline:  Goal status: INITIAL  2.  Patient will be able to demonstrate proper posture and lifting techniques related to spine health and reduction of symptoms.   Baseline:  Goal status: INITIAL  3.  Pt will be able to demonstrate and maintain good core stability without increased pain (with supine lower core exercises)  Baseline:  Goal status: INITIAL  4.  Patient will be able to demonstrate Rt hip/knee/ankle strength to 5/5 in order to maximize functional mobility, ambulation and lifting.  Baseline:  Goal status: INITIAL  5.  Patient will be independent with final HEP upon discharge from PT and report consistent benefit following exercise completion.   Baseline:  Goal status: INITIAL  6.  Pt will be able to report rare Rt LE pain (radiating from low back) with ADLs  Baseline:  Goal status: INITIAL  PLAN:  PT FREQUENCY: 1-2x/week  PT DURATION: 6 weeks  PLANNED INTERVENTIONS: 97164- PT Re-evaluation, 97750- Physical Performance Testing, 97110-Therapeutic exercises, 97530- Therapeutic activity, [id number]- Neuromuscular re-education, 97535- Self Care, [id number]- Manual therapy, Patient/Family education, Balance training, Taping, Cryotherapy, and Moist heat.Aquatics?  PLAN FOR NEXT SESSION: check HEP and progress core stability, posture    Harlene Persons, PTA 08/14/23 7:57 AM Phone: [phone] Fax: [phone]

## 2023-08-14 NOTE — Telephone Encounter (Signed)
 Patient missed her appt today, this is no show x 2. I left her a voicemail regarding this.  I also referenced our attendance policy and that her last appt will be cancelled.  If she would like to return to PT, she would need to make 1 appt at time.   [facility], PT 08/14/23 11:47 AM Phone: [phone] Fax: [phone]

## 2023-08-28 ENCOUNTER — Ambulatory Visit: Admitting: Sports Medicine

## 2023-08-29 ENCOUNTER — Encounter: Admitting: Physical Therapy

## 2023-10-02 ENCOUNTER — Ambulatory Visit: Payer: Self-pay | Admitting: Nurse Practitioner

## 2023-10-02 ENCOUNTER — Encounter: Payer: Self-pay | Admitting: Nurse Practitioner

## 2023-10-02 VITALS — BP 110/70 | HR 75 | Temp 98.9°F | Ht 68.0 in | Wt 206.0 lb

## 2023-10-02 DIAGNOSIS — G44209 Tension-type headache, unspecified, not intractable: Secondary | ICD-10-CM

## 2023-10-02 DIAGNOSIS — F5101 Primary insomnia: Secondary | ICD-10-CM

## 2023-10-02 DIAGNOSIS — I10 Essential (primary) hypertension: Secondary | ICD-10-CM

## 2023-10-02 DIAGNOSIS — Z9889 Other specified postprocedural states: Secondary | ICD-10-CM

## 2023-10-02 DIAGNOSIS — Z6831 Body mass index (BMI) 31.0-31.9, adult: Secondary | ICD-10-CM

## 2023-10-02 DIAGNOSIS — G90A Postural orthostatic tachycardia syndrome (POTS): Secondary | ICD-10-CM

## 2023-10-02 DIAGNOSIS — M25512 Pain in left shoulder: Secondary | ICD-10-CM | POA: Diagnosis not present

## 2023-10-02 DIAGNOSIS — F419 Anxiety disorder, unspecified: Secondary | ICD-10-CM | POA: Diagnosis not present

## 2023-10-02 DIAGNOSIS — Z136 Encounter for screening for cardiovascular disorders: Secondary | ICD-10-CM | POA: Diagnosis not present

## 2023-10-02 DIAGNOSIS — Z7689 Persons encountering health services in other specified circumstances: Secondary | ICD-10-CM

## 2023-10-02 DIAGNOSIS — G4700 Insomnia, unspecified: Secondary | ICD-10-CM

## 2023-10-02 DIAGNOSIS — Z13220 Encounter for screening for lipoid disorders: Secondary | ICD-10-CM

## 2023-10-02 DIAGNOSIS — G8929 Other chronic pain: Secondary | ICD-10-CM | POA: Diagnosis not present

## 2023-10-02 DIAGNOSIS — Z139 Encounter for screening, unspecified: Secondary | ICD-10-CM

## 2023-10-02 DIAGNOSIS — E6609 Other obesity due to excess calories: Secondary | ICD-10-CM

## 2023-10-02 DIAGNOSIS — E559 Vitamin D deficiency, unspecified: Secondary | ICD-10-CM

## 2023-10-02 DIAGNOSIS — Z95818 Presence of other cardiac implants and grafts: Secondary | ICD-10-CM

## 2023-10-02 DIAGNOSIS — E66811 Obesity, class 1: Secondary | ICD-10-CM | POA: Diagnosis not present

## 2023-10-02 DIAGNOSIS — M25511 Pain in right shoulder: Secondary | ICD-10-CM

## 2023-10-02 DIAGNOSIS — R519 Headache, unspecified: Secondary | ICD-10-CM

## 2023-10-02 LAB — POCT URINALYSIS DIP (CLINITEK)
Bilirubin, UA: NEGATIVE
Glucose, UA: NEGATIVE mg/dL
Ketones, POC UA: NEGATIVE mg/dL
Leukocytes, UA: NEGATIVE
Nitrite, UA: NEGATIVE
POC PROTEIN,UA: NEGATIVE
Spec Grav, UA: >=1.03 — AB (ref 1.010–1.025)
Urobilinogen, UA: 0.2 U/dL
pH, UA: 6 (ref 5.0–8.0)

## 2023-10-02 MED ORDER — TRIAMCINOLONE ACETONIDE 40 MG/ML IJ SUSP
40.0000 mg | Freq: Once | INTRAMUSCULAR | Status: AC
  Administered 2023-10-02: 40 mg via INTRAMUSCULAR

## 2023-10-02 MED ORDER — TRAZODONE HCL 100 MG PO TABS: 100.0000 mg | ORAL_TABLET | Freq: Every evening | ORAL | 2 refills | Status: AC | PRN

## 2023-10-02 NOTE — Patient Instructions (Signed)
 Hypertension, Adult Hypertension is another name for high blood pressure. High blood pressure forces your heart to work harder to pump blood. This can cause problems over time. There are two numbers in a blood pressure reading. There is a top number (systolic) over a bottom number (diastolic). It is best to have a blood pressure that is below 120/80. What are the causes? The cause of this condition is not known. Some other conditions can lead to high blood pressure. What increases the risk? Some lifestyle factors can make you more likely to develop high blood pressure: Smoking. Not getting enough exercise or physical activity. Being overweight. Having too much fat, sugar, calories, or salt (sodium) in your diet. Drinking too much alcohol . Other risk factors include: Having any of these conditions: Heart disease. Diabetes. High cholesterol. Kidney disease. Obstructive sleep apnea. Having a family history of high blood pressure and high cholesterol. Age. The risk increases with age. Stress. What are the signs or symptoms? High blood pressure may not cause symptoms. Very high blood pressure (hypertensive crisis) may cause: Headache. Fast or uneven heartbeats (palpitations). Shortness of breath. Nosebleed. Vomiting or feeling like you may vomit (nauseous). Changes in how you see. Very bad chest pain. Feeling dizzy. Seizures. How is this treated? This condition is treated by making healthy lifestyle changes, such as: Eating healthy foods. Exercising more. Drinking less alcohol . Your doctor may prescribe medicine if lifestyle changes do not help enough and if: Your top number is above 130. Your bottom number is above 80. Your personal target blood pressure may vary. Follow these instructions at home: Eating and drinking  If told, follow the DASH eating plan. To follow this plan: Fill one half of your plate at each meal with fruits and vegetables. Fill one fourth of your plate  at each meal with whole grains. Whole grains include whole-wheat pasta, brown rice, and whole-grain bread. Eat or drink low-fat dairy products, such as skim milk or low-fat yogurt. Fill one fourth of your plate at each meal with low-fat (lean) proteins. Low-fat proteins include fish, chicken without skin, eggs, beans, and tofu. Avoid fatty meat, cured and processed meat, or chicken with skin. Avoid pre-made or processed food. Limit the amount of salt in your diet to less than 1,500 mg each day. Do not drink alcohol  if: Your doctor tells you not to drink. You are pregnant, may be pregnant, or are planning to become pregnant. If you drink alcohol : Limit how much you have to: 0-1 drink a day for women. 0-2 drinks a day for men. Know how much alcohol  is in your drink. In the [place] one drink equals one 12 oz bottle of beer (355 mL), one 5 oz glass of wine (148 mL), or one 1 oz glass of hard liquor (44 mL). Lifestyle  Work with your doctor to stay at a healthy weight or to lose weight. Ask your doctor what the best weight is for you. Get at least 30 minutes of exercise that causes your heart to beat faster (aerobic exercise) most days of the week. This may include walking, swimming, or biking. Get at least 30 minutes of exercise that strengthens your muscles (resistance exercise) at least 3 days a week. This may include lifting weights or doing Pilates. Do not smoke or use any products that contain nicotine  or tobacco. If you need help quitting, ask your doctor. Check your blood pressure at home as told by your doctor. Keep all follow-up visits. Medicines Take over-the-counter and prescription medicines  only as told by your doctor. Follow directions carefully. Do not skip doses of blood pressure medicine. The medicine does not work as well if you skip doses. Skipping doses also puts you at risk for problems. Ask your doctor about side effects or reactions to medicines that you should watch  for. Contact a doctor if: You think you are having a reaction to the medicine you are taking. You have headaches that keep coming back. You feel dizzy. You have swelling in your ankles. You have trouble with your vision. Get help right away if: You get a very bad headache. You start to feel mixed up (confused). You feel weak or numb. You feel faint. You have very bad pain in your: Chest. Belly (abdomen). You vomit more than once. You have trouble breathing. These symptoms may be an emergency. Get help right away. Call 911. Do not wait to see if the symptoms will go away. Do not drive yourself to the hospital. Summary Hypertension is another name for high blood pressure. High blood pressure forces your heart to work harder to pump blood. For most people, a normal blood pressure is less than 120/80. Making healthy choices can help lower blood pressure. If your blood pressure does not get lower with healthy choices, you may need to take medicine. This information is not intended to replace advice given to you by your health care provider. Make sure you discuss any questions you have with your health care provider. Document Revised: 11/19/2020 Document Reviewed: 11/19/2020 Elsevier Patient Education  2024 [employer].

## 2023-10-02 NOTE — Progress Notes (Addendum)
 I,Marilyn Rhodes, CMA,acting as a Neurosurgeon for [employer], FNP.,have documented all relevant documentation on the behalf of Marilyn Ada, FNP,as directed by  Marilyn Ada, FNP while in the presence of Marilyn Ada, FNP.  Subjective:  Patient ID: Marilyn Rhodes , female    DOB: 15-Jul-1990 , 33 y.o.   MRN: [id number]  Chief Complaint  Patient presents with   Establish Care    Patient presents today to establish care. She reports she last seen a pcp last year.   Headache    Patient reports she has been having consistent headaches for the past week. She reports the headaches have been on and off.    Shoulder Pain    Patient reports she would like to go back to an orthopaedic for her left shoulder. She reports she previously seen an orthopaedic and had an MRI done on it. She feels she needs another one MRI because her shoulder is back hurting and she is unable to lift her arm.     HPI Discussed the use of AI scribe software for clinical note transcription with the patient, who gave verbal consent to proceed.  History of Present Illness Marilyn Rhodes is a 33 year old female with POTS syndrome who presents with shoulder pain and headaches and to establish care.  She has experienced persistent shoulder pain for a couple of years, with a recent inability to raise her arm past a certain point since earlier this year. She reports that the pain becomes noticeable when she uses her shoulder. 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 She has undergone physical therapy. She currently takes Celebrex  for pain management.  She developed headaches within the last week, described as a band-like sensation from temple to temple, sometimes severe enough to resemble a migraine. Bright lights exacerbate the headaches. During a severe headache episode, her blood pressure was 140/100, although it is typically normal. She has no prior diagnosis of migraines and no family history  of migraines.  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 A loop recorder has been in place for four years and needs removal. She has not seen a cardiologist in a year due to insurance issues and is attempting to reestablish care. She was previously on atenolol , prescribed by her electrophysiologist at [facility], but has not taken it in about a year.  She reports significant sleep disturbances, getting only two to three hours of sleep per night, attributed to her work schedule with long and irregular shifts. She previously took trazodone  100 mg for sleep, which was somewhat effective, but she still experiences fatigue. She has a history of anxiety, previously managed with sertraline and Xanax, but is not currently on any medication for anxiety.   She was a patient with [facility] and has not seen them in over one year. She was referred by several people that come here. 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     Headache  This is a new problem. The current episode started 1 to 4 weeks ago. The problem occurs constantly. The pain does not radiate. The pain quality is not similar to prior headaches. The quality of the pain is described as squeezing. The pain is at a severity of 8/10. The pain is severe. Pertinent negatives include no coughing or fever. The symptoms are aggravated by bright light. Treatments tried: scheduled celebrex . Her past medical history is significant for migraines  in the family (grandfather) and obesity. There is no history of cancer, hypertension or migraine headaches.  Shoulder Pain  The pain is present in the left shoulder. This is a chronic problem. The current episode started more than 1 year ago. Pertinent negatives include no fever. The symptoms are aggravated by activity. She has tried NSAIDS (celebrex  only) for the symptoms.     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)    Benign positional vertigo    Bilateral shoulder pain    Bilateral thoracic back pain    Carpal tunnel syndrome    Cough    Dizziness    Dysmenorrhea    ECG abnormal    Fatigue    Flu    History of degenerative disc disease    Hypercholesteremia    Hypomagnesemia    Iron deficiency    Lower respiratory tract infection    Lumbar back pain    Muscle spasm of back    Myalgia    Nausea    Oligomenorrhea    Palpitations    Pneumonia, bacterial    Pre-diabetes    Right wrist pain    SOB (shortness of breath) on exertion    Vitamin D  deficiency      Family History  Problem Relation Age of Onset   Endometriosis Mother    Factor VIII deficiency Father    Endometriosis Maternal Grandmother    Heart failure Paternal Grandmother    Parkinson's disease Paternal Grandmother    Hypertension Paternal Grandmother    Diabetes Paternal Grandmother      Current Outpatient Medications:    celecoxib  (CELEBREX ) 200 MG capsule, Take 1 capsule (200 mg total) by mouth daily., Disp: 30 capsule, Rfl: 0   etodolac (LODINE XL) 400 MG 24 hr tablet, Take 400 mg by mouth daily as needed. , Disp: , Rfl:    methocarbamol  (ROBAXIN ) 500 MG tablet, Take 1 tablet (500 mg total) by mouth 2 (two) times daily., Disp: 20 tablet, Rfl: 0   midodrine (PROAMATINE) 2.5 MG tablet, Take 2.5 mg by mouth 3 (three) times daily with meals., Disp: , Rfl:    promethazine -dextromethorphan (PROMETHAZINE -DM) 6.25-15 MG/5ML syrup, Take 5 mLs by mouth 3 (three) times daily as needed for cough., Disp: 200 mL, Rfl: 0   Pseudoeph-Doxylamine-DM-APAP (NYQUIL PO), Take by mouth., Disp: , Rfl:    atenolol  (TENORMIN ) 25 MG tablet, Take 1 tablet (25 mg total) by mouth daily., Disp: 90 tablet, Rfl: 1   busPIRone  (BUSPAR ) 5 MG tablet, Take 1 tablet (5 mg total) by mouth 2 (two) times daily., Disp: 180 tablet, Rfl: 1   ondansetron  (ZOFRAN -ODT) 4 MG disintegrating tablet, Take 1  tablet (4 mg total) by mouth every 8 (eight) hours as needed for nausea or vomiting. (Patient not taking: Reported on 11/28/2023), Disp: 20 tablet, Rfl: 0   traZODone  (DESYREL ) 100 MG tablet, Take 1 tablet (100 mg total) by mouth at bedtime as needed for sleep., Disp: 30 tablet, Rfl: 2   Vitamin D , Ergocalciferol , (DRISDOL ) 1.25 MG (50000 UNIT) CAPS capsule, Take 1 capsule (50,000 Units total) by mouth every 7 (seven) days., Disp: 12 capsule, Rfl: 0   Allergies  Allergen Reactions   Penicillins Hives   Albuterol      Review of Systems  Constitutional: Negative.  Negative for fatigue and fever.  Eyes: Negative.   Respiratory: Negative.  Negative for cough.  Cardiovascular: Negative.  Negative for chest pain, palpitations and leg swelling.  Gastrointestinal:  Negative for abdominal distention.  Musculoskeletal: Negative.   Skin: Negative.   Neurological:  Positive for headaches.  Psychiatric/Behavioral: Negative.       Today's Vitals   10/02/23 0908  BP: 110/70  Pulse: 75  Temp: 98.9 F (37.2 C)  TempSrc: Oral  Weight: 206 lb (93.4 kg)  Height: 5' 8 (1.727 m)  PainSc: 0-No pain   Body mass index is 31.32 kg/m.  Wt Readings from Last 3 Encounters:  11/28/23 201 lb 9.6 oz (91.4 kg)  10/30/23 202 lb 9.6 oz (91.9 kg)  10/02/23 206 lb (93.4 kg)     Objective:  Physical Exam Vitals and nursing note reviewed.  Constitutional:      General: She is not in acute distress.    Appearance: She is well-developed.  Cardiovascular:     Heart sounds: Normal heart sounds.  Pulmonary:     Effort: Pulmonary effort is normal. No respiratory distress.     Breath sounds: Normal breath sounds. No wheezing.  Neurological:     Mental Status: She is alert and oriented to person, place, and time.  Psychiatric:        Mood and Affect: Mood normal. Mood is not anxious.        Speech: Speech normal.        Behavior: Behavior normal. Behavior is not agitated.      Assessment And Plan:   Establishing care with new doctor, encounter for Assessment & Plan: Patient is here to establish care. Went over patient medical, family, social and surgical history. Reviewed with patient their medications and any allergies  Reviewed with patient their sexual orientation, drug/tobacco and alcohol use Dicussed any new concerns with patient  recommended patient comes in for a physical exam and complete blood work.  Educated patient about the importance of annual screenings and immunizations.  Advised patient to eat a healthy diet along with exercise for atleast 30-45 min at least 4-5 days of the week.     Essential hypertension -     POCT URINALYSIS DIP (CLINITEK) -     Microalbumin / creatinine urine ratio -     CBC -     CMP14+EGFR  Chronic left shoulder pain Assessment & Plan: Chronic right shoulder pain with limited range of motion. Previous MRI showed bursitis, arthropathy, and neuropathy. Pain exacerbated by movement. - Refer to orthopedics for evaluation and possible intervention. - Consider dry needling for muscle tension.  Orders: -     Ambulatory referral to Orthopedics  Acute non intractable tension-type headache Assessment & Plan: New onset severe headaches described as band-like with photophobia and hypertension during episodes. Likely tension-type headache exacerbated by lack of sleep. - Administer Kenalog  steroid shot to reduce inflammation and break headache cycle. - Monitor headache frequency and severity. - Advise to report if headaches become more frequent.  Orders: -     Triamcinolone  Acetonide  POTS (postural orthostatic tachycardia syndrome) Assessment & Plan: POTS with occasional episodes. Requires follow-up for loop recorder removal and re-establishment of care with [facility] electrophysiologist. - Facilitate re-establishment with [facility] electrophysiologist for loop recorder removal and ongoing POTS management.  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 No recent cardiology follow-up. - Refer to cardiology for follow-up and management.  Orders: -  Ambulatory referral to Cardiology  Vitamin D  deficiency -     VITAMIN D  25 Hydroxy (Vit-D Deficiency, Fractures)  Primary insomnia Assessment & Plan: Chronic insomnia exacerbated by irregular work schedule and shift work. Trazodone  100 mg previously effective but causes residual tiredness. - Refill trazodone  100 mg tablets. - Discuss sleep hygiene and lifestyle modifications. - Plan to discuss alternative sleep medications in 4-6 weeks.  Orders: -     traZODone  HCl; Take 1 tablet (100 mg total) by mouth at bedtime as needed for sleep.  Dispense: 30 tablet; Refill: 2  Class 1 obesity due to excess calories with serious comorbidity and body mass index (BMI) of 31.0 to 31.9 in adult  Encounter for lipid screening for cardiovascular disease Assessment & Plan: Previous LDL of 101 mg/dL, slightly above target for cardiac history. No recent lipid panel in the past year. - Order baseline blood work to reassess lipid levels.  Orders: -     Lipid panel  History of radiofrequency ablation procedure for cardiac arrhythmia -     Ambulatory referral to Cardiology  Encounter for screening -     Hepatitis B surface antibody,qualitative  Anxiety Assessment & Plan: Anxiety disorder affecting sleep. Previously treated with sertraline and Xanax. Not currently taking medication for anxiety. - Plan to discuss anxiety management and potential medication options in 4-6 weeks.   Insomnia, unspecified type Assessment & Plan: Chronic insomnia exacerbated by irregular work schedule and shift work. Trazodone  100 mg previously effective but causes residual tiredness. - Refill trazodone  100 mg tablets. - Discuss sleep hygiene and lifestyle modifications. -  Plan to discuss alternative sleep medications in 4-6 weeks.      Return in about 4 months (around 02/01/2024) for bpc; 4-6 weeks f/u insomnia.  Patient was given opportunity to ask questions. Patient verbalized understanding of the plan and was able to repeat key elements of the plan. All questions were answered to their satisfaction.    LILLETTE Marilyn Ada, FNP, have reviewed all documentation for this visit. The documentation on 10/02/23 for the exam, diagnosis, procedures, and orders are all accurate and complete.   IF YOU HAVE BEEN REFERRED TO A SPECIALIST, IT MAY TAKE 1-2 WEEKS TO SCHEDULE/PROCESS THE REFERRAL. IF YOU HAVE NOT HEARD FROM US /SPECIALIST IN TWO WEEKS, PLEASE GIVE US  A CALL AT [phone] X [phone].

## 2023-10-03 LAB — MICROALBUMIN / CREATININE URINE RATIO
Creatinine, Urine: 190.1 mg/dL
Microalb/Creat Ratio: 2 mg/g{creat} (ref 0–29)
Microalbumin, Urine: 3 ug/mL

## 2023-10-03 LAB — CBC
Hematocrit: 41.1 % (ref 34.0–46.6)
Hemoglobin: 13.1 g/dL (ref 11.1–15.9)
MCH: 31.8 pg (ref 26.6–33.0)
MCHC: 31.9 g/dL (ref 31.5–35.7)
MCV: 100 fL — ABNORMAL HIGH (ref 79–97)
Platelets: 223 x10E3/uL (ref 150–450)
RBC: 4.12 x10E6/uL (ref 3.77–5.28)
RDW: 13.1 % (ref 11.7–15.4)
WBC: 8.1 x10E3/uL (ref 3.4–10.8)

## 2023-10-03 LAB — HEPATITIS B SURFACE ANTIBODY,QUALITATIVE: Hep B Surface Ab, Qual: REACTIVE

## 2023-10-03 LAB — CMP14+EGFR
ALT: 6 IU/L (ref 0–32)
AST: 11 IU/L (ref 0–40)
Albumin: 4.3 g/dL (ref 3.9–4.9)
Alkaline Phosphatase: 75 IU/L (ref 44–121)
BUN/Creatinine Ratio: 11 (ref 9–23)
BUN: 10 mg/dL (ref 6–20)
Bilirubin Total: <0.2 mg/dL (ref 0.0–1.2)
CO2: 22 mmol/L (ref 20–29)
Calcium: 9.6 mg/dL (ref 8.7–10.2)
Chloride: 101 mmol/L (ref 96–106)
Creatinine, Ser: 0.92 mg/dL (ref 0.57–1.00)
Globulin, Total: 2.5 g/dL (ref 1.5–4.5)
Glucose: 90 mg/dL (ref 70–99)
Potassium: 4.4 mmol/L (ref 3.5–5.2)
Sodium: 138 mmol/L (ref 134–144)
Total Protein: 6.8 g/dL (ref 6.0–8.5)
eGFR: 84 mL/min/1.73 (ref >59)

## 2023-10-03 LAB — LIPID PANEL
Chol/HDL Ratio: 2.2 ratio (ref 0.0–4.4)
Cholesterol, Total: 225 mg/dL — ABNORMAL HIGH (ref 100–199)
HDL: 104 mg/dL (ref >39)
LDL Chol Calc (NIH): 113 mg/dL — ABNORMAL HIGH (ref 0–99)
Triglycerides: 46 mg/dL (ref 0–149)
VLDL Cholesterol Cal: 8 mg/dL (ref 5–40)

## 2023-10-03 LAB — VITAMIN D 25 HYDROXY (VIT D DEFICIENCY, FRACTURES): Vit D, 25-Hydroxy: 20.1 ng/mL — ABNORMAL LOW (ref 30.0–100.0)

## 2023-10-04 ENCOUNTER — Encounter: Payer: Self-pay | Admitting: Nurse Practitioner

## 2023-10-04 MED ORDER — VITAMIN D (ERGOCALCIFEROL) 1.25 MG (50000 UNIT) PO CAPS: 50000.0000 [IU] | ORAL_CAPSULE | ORAL | 0 refills | Status: AC

## 2023-10-12 ENCOUNTER — Ambulatory Visit: Payer: Self-pay | Admitting: Nurse Practitioner

## 2023-10-12 DIAGNOSIS — G4700 Insomnia, unspecified: Secondary | ICD-10-CM | POA: Insufficient documentation

## 2023-10-12 DIAGNOSIS — F419 Anxiety disorder, unspecified: Secondary | ICD-10-CM | POA: Insufficient documentation

## 2023-10-12 DIAGNOSIS — G8929 Other chronic pain: Secondary | ICD-10-CM | POA: Insufficient documentation

## 2023-10-12 DIAGNOSIS — Z7689 Persons encountering health services in other specified circumstances: Secondary | ICD-10-CM | POA: Insufficient documentation

## 2023-10-12 DIAGNOSIS — Z13220 Encounter for screening for lipoid disorders: Secondary | ICD-10-CM | POA: Insufficient documentation

## 2023-10-12 NOTE — Assessment & Plan Note (Signed)
 New onset severe headaches described as band-like with photophobia and hypertension during episodes. Likely tension-type headache exacerbated by lack of sleep. - Administer Kenalog  steroid shot to reduce inflammation and break headache cycle. - Monitor headache frequency and severity. - Advise to report if headaches become more frequent.

## 2023-10-12 NOTE — Assessment & Plan Note (Signed)
 Anxiety disorder affecting sleep. Previously treated with sertraline and Xanax. Not currently taking medication for anxiety. - Plan to discuss anxiety management and potential medication options in 4-6 weeks.

## 2023-10-12 NOTE — Assessment & Plan Note (Signed)
 Previous LDL of 101 mg/dL, slightly above target for cardiac history. No recent lipid panel in the past year. - Order baseline blood work to reassess lipid levels.

## 2023-10-12 NOTE — Assessment & Plan Note (Signed)

## 2023-10-12 NOTE — Assessment & Plan Note (Signed)
 Chronic insomnia exacerbated by irregular work schedule and shift work. Trazodone  100 mg previously effective but causes residual tiredness. - Refill trazodone  100 mg tablets. - Discuss sleep hygiene and lifestyle modifications. - Plan to discuss alternative sleep medications in 4-6 weeks.

## 2023-10-12 NOTE — Assessment & Plan Note (Signed)
 POTS with occasional episodes. Requires follow-up for loop recorder removal and re-establishment of care with [facility] electrophysiologist. - Facilitate re-establishment with [facility] electrophysiologist for loop recorder removal and ongoing POTS management.

## 2023-10-12 NOTE — Assessment & Plan Note (Signed)
 Chronic right shoulder pain with limited range of motion. Previous MRI showed bursitis, arthropathy, and neuropathy. Pain exacerbated by movement. - Refer to orthopedics for evaluation and possible intervention. - Consider dry needling for muscle tension.

## 2023-10-12 NOTE — Assessment & Plan Note (Signed)
 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 No recent cardiology follow-up. - Refer to cardiology for follow-up and management.

## 2023-10-16 ENCOUNTER — Encounter: Payer: Self-pay | Admitting: Nurse Practitioner

## 2023-10-18 ENCOUNTER — Other Ambulatory Visit: Payer: Self-pay | Admitting: Nurse Practitioner

## 2023-10-18 MED ORDER — ATENOLOL 25 MG PO TABS: 25.0000 mg | ORAL_TABLET | Freq: Every day | ORAL | 1 refills | Status: AC

## 2023-10-18 NOTE — Telephone Encounter (Signed)
 Schedule 1 week NV Blood pressure check

## 2023-10-24 ENCOUNTER — Ambulatory Visit: Payer: Self-pay

## 2023-10-30 ENCOUNTER — Encounter: Payer: Self-pay | Admitting: Nurse Practitioner

## 2023-10-30 ENCOUNTER — Ambulatory Visit: Admitting: Nurse Practitioner

## 2023-10-30 VITALS — BP 120/80 | HR 70 | Temp 98.5°F | Ht 68.0 in | Wt 202.6 lb

## 2023-10-30 DIAGNOSIS — E6609 Other obesity due to excess calories: Secondary | ICD-10-CM

## 2023-10-30 DIAGNOSIS — E66811 Obesity, class 1: Secondary | ICD-10-CM

## 2023-10-30 DIAGNOSIS — F32A Depression, unspecified: Secondary | ICD-10-CM | POA: Diagnosis not present

## 2023-10-30 DIAGNOSIS — Z6830 Body mass index (BMI) 30.0-30.9, adult: Secondary | ICD-10-CM | POA: Diagnosis not present

## 2023-10-30 DIAGNOSIS — Z2821 Immunization not carried out because of patient refusal: Secondary | ICD-10-CM

## 2023-10-30 DIAGNOSIS — G4700 Insomnia, unspecified: Secondary | ICD-10-CM

## 2023-10-30 DIAGNOSIS — F419 Anxiety disorder, unspecified: Secondary | ICD-10-CM

## 2023-10-30 MED ORDER — BUSPIRONE HCL 5 MG PO TABS: 5.0000 mg | ORAL_TABLET | Freq: Two times a day (BID) | ORAL | 1 refills | Status: DC

## 2023-10-30 NOTE — Progress Notes (Signed)
 [name], CMA,acting as a Neurosurgeon for Gaines Ada, FNP.,have documented all relevant documentation on the behalf of Gaines Ada, FNP,as directed by  Gaines Ada, FNP while in the presence of Gaines Ada, FNP.  Subjective:  Patient ID: Marilyn Rhodes , female    DOB: [birth year] , 33 y.o.   MRN: [id number]  Chief Complaint  Patient presents with   Insomnia    Patient presents today for a insomnia follow up, Patient reports compliance with medication. Patient denies any chest pain, SOB, or headaches. Patient reports she only gets 3/4 hours of sleep a night. She reports she is still having a lot of trouble staying asleep.      HPI  Discussed the use of AI scribe software for clinical note transcription with the patient, who gave verbal consent to proceed.  History of Present Illness Marilyn Rhodes is a 33 year old female who presents with chronic sleep disturbances.  She has been experiencing chronic sleep disturbances for the last couple of years, primarily characterized by difficulty staying asleep. She can fall asleep but often wakes up after two to four hours. The onset of her sleep issues coincided with her previous job, where she worked seven days a week and was on call 24 hours a day, disrupting her sleep patterns.  She has been using trazodone  as a sleep aid but finds it ineffective, as she still wakes up after a few hours. She avoids taking trazodone  when she has to work because it causes grogginess and increased fatigue. She has not tried any natural supplements like melatonin yet.  Her work schedule is variable, with shifts sometimes extending to 12 hours. Recently, she worked 64 hours in one week, including 28 hours of overtime, which is not typical for her. Despite the heavy workload, her sleep does not improve when she works less.  She experiences anxiety, describing her mind as 'constantly, always running' and sometimes feeling her heart race and hands get sweaty. She has  previously been prescribed Xanax for anxiety by a previous provider but has not been on any medication for anxiety recently.  She was seeing a therapist for almost a year until her insurance changed, making it financially unfeasible to continue. She felt that therapy was helping her mood improve gradually. Although she did not notice a significant change in her sleep pattern during therapy, she felt progress was being made on underlying issues.  She has not had a full eight hours of sleep in a long time and finds this frustrating.   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)    Benign positional vertigo    Bilateral shoulder pain    Bilateral thoracic back pain    Carpal tunnel syndrome    Cough    Dizziness    Dysmenorrhea    ECG abnormal    Fatigue    Flu    History of degenerative disc disease    Hypercholesteremia    Hypomagnesemia    Iron deficiency    Lower respiratory tract infection    Lumbar back pain    Muscle spasm of back    Myalgia    Nausea    Oligomenorrhea    Palpitations    Pneumonia, bacterial    Pre-diabetes    Right wrist pain    SOB (shortness of breath) on exertion    Vitamin D  deficiency  Family History  Problem Relation Age of Onset   Endometriosis Mother    Factor VIII deficiency Father    Endometriosis Maternal Grandmother    Heart failure Paternal Grandmother    Parkinson's disease Paternal Grandmother    Hypertension Paternal Grandmother    Diabetes Paternal Grandmother      Current Outpatient Medications:    atenolol  (TENORMIN ) 25 MG tablet, Take 1 tablet (25 mg total) by mouth daily., Disp: 90 tablet, Rfl: 1   busPIRone  (BUSPAR ) 5 MG tablet, Take 1 tablet (5 mg total) by mouth 2 (two) times daily., Disp: 180 tablet, Rfl: 1   celecoxib  (CELEBREX ) 200 MG capsule, Take 1 capsule (200 mg total) by mouth daily., Disp: 30 capsule, Rfl: 0   etodolac (LODINE  XL) 400 MG 24 hr tablet, Take 400 mg by mouth daily as needed. , Disp: , Rfl:    methocarbamol  (ROBAXIN ) 500 MG tablet, Take 1 tablet (500 mg total) by mouth 2 (two) times daily., Disp: 20 tablet, Rfl: 0   midodrine (PROAMATINE) 2.5 MG tablet, Take 2.5 mg by mouth 3 (three) times daily with meals., Disp: , Rfl:    promethazine -dextromethorphan (PROMETHAZINE -DM) 6.25-15 MG/5ML syrup, Take 5 mLs by mouth 3 (three) times daily as needed for cough., Disp: 200 mL, Rfl: 0   Pseudoeph-Doxylamine-DM-APAP (NYQUIL PO), Take by mouth., Disp: , Rfl:    traZODone  (DESYREL ) 100 MG tablet, Take 1 tablet (100 mg total) by mouth at bedtime as needed for sleep., Disp: 30 tablet, Rfl: 2   Vitamin D , Ergocalciferol , (DRISDOL ) 1.25 MG (50000 UNIT) CAPS capsule, Take 1 capsule (50,000 Units total) by mouth every 7 (seven) days., Disp: 12 capsule, Rfl: 0   ondansetron  (ZOFRAN -ODT) 4 MG disintegrating tablet, Take 1 tablet (4 mg total) by mouth every 8 (eight) hours as needed for nausea or vomiting. (Patient not taking: Reported on 10/30/2023), Disp: 20 tablet, Rfl: 0   Allergies  Allergen Reactions   Penicillins Hives   Albuterol      Review of Systems  Constitutional: Negative.  Negative for fatigue and fever.  Eyes: Negative.   Respiratory: Negative.  Negative for cough.   Cardiovascular: Negative.  Negative for chest pain, palpitations and leg swelling.  Gastrointestinal:  Negative for abdominal distention.  Musculoskeletal: Negative.   Skin: Negative.   Neurological:  Negative for headaches.  Psychiatric/Behavioral: Negative.       Today's Vitals   10/30/23 1206  BP: 120/80  Pulse: 70  Temp: 98.5 F (36.9 C)  TempSrc: Oral  Weight: 202 lb 9.6 oz (91.9 kg)  Height: 5' 8 (1.727 m)  PainSc: 0-No pain   Body mass index is 30.81 kg/m.  Wt Readings from Last 3 Encounters:  10/30/23 202 lb 9.6 oz (91.9 kg)  10/02/23 206 lb (93.4 kg)  01/22/23 195 lb (88.5 kg)     Objective:  Physical  Exam Vitals and nursing note reviewed.  Constitutional:      General: She is not in acute distress.    Appearance: Normal appearance. She is well-developed. She is obese.  Cardiovascular:     Rate and Rhythm: Normal rate and regular rhythm.     Pulses: Normal pulses.     Heart sounds: Normal heart sounds. No murmur heard. Pulmonary:     Effort: Pulmonary effort is normal. No respiratory distress.     Breath sounds: Normal breath sounds. No wheezing.  Neurological:     Mental Status: She is alert and oriented to person, place, and time.  Psychiatric:  Mood and Affect: Mood normal. Mood is not anxious.        Speech: Speech normal.        Behavior: Behavior normal. Behavior is not agitated.      Assessment And Plan:  Insomnia, unspecified type Assessment & Plan: Chronic difficulty staying asleep, frequent awakenings. Trazodone  ineffective, causing grogginess. Anxiety may contribute. - Start buspirone  for anxiety, may aid sleep. - Recommend melatonin, starting with 3 mg, to aid sleep without morning grogginess.  Orders: -     Amb ref to [facility]  Influenza vaccination declined  COVID-19 vaccination declined  Tetanus, diphtheria, and acellular pertussis (Tdap) vaccination declined  Class 1 obesity due to excess calories with body mass index (BMI) of 30.0 to 30.9 in adult, unspecified whether serious comorbidity present Assessment & Plan: She is encouraged to strive for BMI less than 30 to decrease cardiac risk. Advised to aim for at least 150 minutes of exercise per week.    Anxiety and depression Assessment & Plan: Anxiety with racing thoughts, sweating, tachycardia, and mood fluctuations. Previous alprazolam use. Therapy beneficial but discontinued. Depression screening mild. - Start buspirone , 1 tablet morning and evening. - Refer to in-office licensed Child psychotherapist for assessment and potential psychiatrist collaboration.  Orders: -      busPIRone  HCl; Take 1 tablet (5 mg total) by mouth 2 (two) times daily.  Dispense: 180 tablet; Refill: 1    Return for keep same next; schedule appt with [facility].  Patient was given opportunity to ask questions. Patient verbalized understanding of the plan and was able to repeat key elements of the plan. All questions were answered to their satisfaction.   Collaboration of Care: Referral or follow-up with counselor/therapist [facility]   Patient/Guardian was advised Release of Information must be obtained prior to any record release in order to collaborate their care with an outside provider. Patient/Guardian was advised if they have not already done so to contact the registration department to sign all necessary forms in order for us  to release information regarding their care   I, Gaines Ada, FNP, have reviewed all documentation for this visit. The documentation on 10/30/23 for the exam, diagnosis, procedures, and orders are all accurate and complete.   IF YOU HAVE BEEN REFERRED TO A SPECIALIST, IT MAY TAKE 1-2 WEEKS TO SCHEDULE/PROCESS THE REFERRAL. IF YOU HAVE NOT HEARD FROM US /SPECIALIST IN TWO WEEKS, PLEASE GIVE US  A CALL AT [phone] X [phone].

## 2023-11-02 ENCOUNTER — Ambulatory Visit: Payer: Self-pay | Admitting: Licensed Clinical Social Worker

## 2023-11-02 DIAGNOSIS — Z91199 Patient's noncompliance with other medical treatment and regimen due to unspecified reason: Secondary | ICD-10-CM

## 2023-11-05 DIAGNOSIS — F32A Depression, unspecified: Secondary | ICD-10-CM | POA: Insufficient documentation

## 2023-11-05 DIAGNOSIS — E66811 Obesity, class 1: Secondary | ICD-10-CM | POA: Insufficient documentation

## 2023-11-05 NOTE — Assessment & Plan Note (Signed)
 Chronic difficulty staying asleep, frequent awakenings. Trazodone  ineffective, causing grogginess. Anxiety may contribute. - Start buspirone  for anxiety, may aid sleep. - Recommend melatonin, starting with 3 mg, to aid sleep without morning grogginess.

## 2023-11-05 NOTE — Assessment & Plan Note (Signed)
 She is encouraged to strive for BMI less than 30 to decrease cardiac risk. Advised to aim for at least 150 minutes of exercise per week.

## 2023-11-05 NOTE — Assessment & Plan Note (Signed)
 Anxiety with racing thoughts, sweating, tachycardia, and mood fluctuations. Previous alprazolam use. Therapy beneficial but discontinued. Depression screening mild. - Start buspirone , 1 tablet morning and evening. - Refer to in-office licensed Child psychotherapist for assessment and potential psychiatrist collaboration.

## 2023-11-13 NOTE — BH Specialist Note (Signed)
 Pt cancelled [facility] appt

## 2023-11-28 ENCOUNTER — Ambulatory Visit: Payer: Self-pay | Admitting: Nurse Practitioner

## 2023-11-28 VITALS — BP 130/60 | HR 64 | Temp 98.5°F | Ht 68.0 in | Wt 201.6 lb

## 2023-11-28 DIAGNOSIS — R200 Anesthesia of skin: Secondary | ICD-10-CM

## 2023-11-28 DIAGNOSIS — M7542 Impingement syndrome of left shoulder: Secondary | ICD-10-CM

## 2023-11-28 DIAGNOSIS — I10 Essential (primary) hypertension: Secondary | ICD-10-CM

## 2023-11-28 NOTE — Progress Notes (Signed)
 Marilyn Rhodes, CMA,acting as a neurosurgeon for Gaines Ada, FNP.,have documented all relevant documentation on the behalf of Gaines Ada, FNP,as directed by  Gaines Ada, FNP while in the presence of Gaines Ada, FNP.  Subjective:  Patient ID: Marilyn Rhodes , female    DOB: 01-Dec-1990 , 33 y.o.   MRN: [id number]  Chief Complaint  Patient presents with   Hypertension    Patient presents today for a bp follow up, Patient reports compliance with medication. Patient denies any chest pain, SOB, or headaches. Patient has no concerns today. Patient reports her insomnia is the same. Patient reports her right arm went numb for the night. Patient reports she took her medication from ortho and it helped.   Discussed the use of AI scribe software for clinical note transcription with the patient, who gave verbal consent to proceed.  History of Present Illness Marilyn Rhodes is a 33 year old female with carpal tunnel syndrome who presents with right arm numbness and chronic left shoulder pain.  She experiences severe numbness in her right arm, which occurred the other night, preventing her from holding or doing anything and disrupting her sleep. The numbness was associated with nerve pain and was not relieved by any actions at the time. She took Celebrex , which seemed to calm the symptoms. She suspects the numbness may be related to her carpal tunnel syndrome, although she has not been engaging in repetitive activities with her hands recently. Currently, the numbness has resolved, but she still feels a sensation in her elbow area.  She has a history of carpal tunnel syndrome, confirmed by a previous EMG nerve conduction study, which she describes as a painful experience. She does not wear a wrist splint at night due to discomfort with her current splint, which has a large piece in the middle.  She reports chronic left shoulder pain. She has not seen an orthopedic specialist for this issue in over three years.  She recalls having an MRI on the left shoulder in the past. The left shoulder pain limits her ability to perform activities, and she notes that the shoulder 'droops' and is 'definitely lower.' She has not received recent treatment for this condition.   She has been on the buspirone  for the last week twice a day. Her daughter was sick so she missed the appt.     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)    Benign positional vertigo    Bilateral shoulder pain    Bilateral thoracic back pain    Carpal tunnel syndrome    Cough    Dizziness    Dysmenorrhea    ECG abnormal    Fatigue    Flu    History of degenerative disc disease    Hypercholesteremia    Hypomagnesemia    Iron deficiency    Lower respiratory tract infection    Lumbar back pain    Muscle spasm of back    Myalgia    Nausea    Oligomenorrhea    Palpitations    Pneumonia, bacterial    Pre-diabetes    Right wrist pain    SOB (shortness of breath) on exertion    Vitamin D  deficiency      Family History  Problem Relation Age of Onset   Endometriosis Mother    Factor VIII deficiency Father    Endometriosis Maternal Grandmother    Heart failure  Paternal Grandmother    Parkinson's disease Paternal Grandmother    Hypertension Paternal Grandmother    Diabetes Paternal Grandmother      Current Outpatient Medications:    atenolol  (TENORMIN ) 25 MG tablet, Take 1 tablet (25 mg total) by mouth daily., Disp: 90 tablet, Rfl: 1   busPIRone  (BUSPAR ) 5 MG tablet, Take 1 tablet (5 mg total) by mouth 2 (two) times daily., Disp: 180 tablet, Rfl: 1   celecoxib  (CELEBREX ) 200 MG capsule, Take 1 capsule (200 mg total) by mouth daily., Disp: 30 capsule, Rfl: 0   etodolac (LODINE XL) 400 MG 24 hr tablet, Take 400 mg by mouth daily as needed. , Disp: , Rfl:    methocarbamol  (ROBAXIN ) 500 MG tablet, Take 1 tablet (500 mg total) by mouth 2 (two) times  daily., Disp: 20 tablet, Rfl: 0   midodrine (PROAMATINE) 2.5 MG tablet, Take 2.5 mg by mouth 3 (three) times daily with meals., Disp: , Rfl:    promethazine -dextromethorphan (PROMETHAZINE -DM) 6.25-15 MG/5ML syrup, Take 5 mLs by mouth 3 (three) times daily as needed for cough., Disp: 200 mL, Rfl: 0   Pseudoeph-Doxylamine-DM-APAP (NYQUIL PO), Take by mouth., Disp: , Rfl:    traZODone  (DESYREL ) 100 MG tablet, Take 1 tablet (100 mg total) by mouth at bedtime as needed for sleep., Disp: 30 tablet, Rfl: 2   Vitamin D , Ergocalciferol , (DRISDOL ) 1.25 MG (50000 UNIT) CAPS capsule, Take 1 capsule (50,000 Units total) by mouth every 7 (seven) days., Disp: 12 capsule, Rfl: 0   ondansetron  (ZOFRAN -ODT) 4 MG disintegrating tablet, Take 1 tablet (4 mg total) by mouth every 8 (eight) hours as needed for nausea or vomiting. (Patient not taking: Reported on 11/28/2023), Disp: 20 tablet, Rfl: 0   Allergies  Allergen Reactions   Penicillins Hives   Albuterol      Review of Systems  Constitutional: Negative.  Negative for fatigue and fever.  Eyes: Negative.   Respiratory: Negative.  Negative for cough and wheezing.   Cardiovascular: Negative.  Negative for chest pain, palpitations and leg swelling.  Gastrointestinal:  Negative for abdominal distention.  Musculoskeletal: Negative.   Skin: Negative.   Neurological:  Negative for headaches.  Psychiatric/Behavioral: Negative.       Today's Vitals   11/28/23 1438  BP: 130/60  Pulse: 64  Temp: 98.5 F (36.9 C)  TempSrc: Oral  Weight: 201 lb 9.6 oz (91.4 kg)  Height: 5' 8 (1.727 m)  PainSc: 0-No pain   Body mass index is 30.65 kg/m.  Wt Readings from Last 3 Encounters:  11/28/23 201 lb 9.6 oz (91.4 kg)  10/30/23 202 lb 9.6 oz (91.9 kg)  10/02/23 206 lb (93.4 kg)     Objective:  Physical Exam Vitals and nursing note reviewed.  Constitutional:      General: She is not in acute distress.    Appearance: Normal appearance. She is well-developed.  She is obese.  Cardiovascular:     Rate and Rhythm: Normal rate and regular rhythm.     Pulses: Normal pulses.     Heart sounds: Normal heart sounds. No murmur heard. Pulmonary:     Effort: Pulmonary effort is normal. No respiratory distress.     Breath sounds: Normal breath sounds. No wheezing.  Neurological:     Mental Status: She is alert and oriented to person, place, and time.  Psychiatric:        Mood and Affect: Mood normal. Mood is not anxious.        Speech: Speech normal.  Behavior: Behavior normal. Behavior is not agitated.         Assessment And Plan:  Essential hypertension Assessment & Plan: Blood pressure is well controlled. Continue current medications   Right arm numbness Assessment & Plan: Intermittent numbness in the right arm, likely related to carpal tunnel syndrome. Celebrex  effective in managing symptoms. - Consider using a wrist splint at night to alleviate symptoms. - Obtain a new wrist splint that is more comfortable for nighttime use.   Impingement syndrome of left shoulder region Assessment & Plan: Chronic left shoulder pain with limited range of motion. MRI confirmed impingement syndrome. - Refer to orthopedic specialist for further evaluation and management. - Contact Dr. Donaciano Sprang' office to schedule an appointment for left shoulder evaluation. - Inform the orthopedic office that the referral is for the left shoulder.     Return for keep same next.  Patient was given opportunity to ask questions. Patient verbalized understanding of the plan and was able to repeat key elements of the plan. All questions were answered to their satisfaction.    Marilyn Gaines Ada, FNP, have reviewed all documentation for this visit. The documentation on 11/28/23 for the exam, diagnosis, procedures, and orders are all accurate and complete.   IF YOU HAVE BEEN REFERRED TO A SPECIALIST, IT MAY TAKE 1-2 WEEKS TO SCHEDULE/PROCESS THE REFERRAL. IF YOU HAVE  NOT HEARD FROM US /SPECIALIST IN TWO WEEKS, PLEASE GIVE US  A CALL AT [phone].

## 2023-11-30 ENCOUNTER — Ambulatory Visit (INDEPENDENT_AMBULATORY_CARE_PROVIDER_SITE_OTHER): Payer: Self-pay | Admitting: Licensed Clinical Social Worker

## 2023-11-30 DIAGNOSIS — Z91199 Patient's noncompliance with other medical treatment and regimen due to unspecified reason: Secondary | ICD-10-CM

## 2023-11-30 NOTE — BH Specialist Note (Signed)
 [facility] Clinician attempted to connect with patient for scheduled appointment in office. Patient did not show up for the appointment. After waiting 15 minutes, clinician attempted to reach pt by phone to offer virtual visit.   Pt did not answer phone and clinician left HIPAA compliant voicemail for pt to call office to reschedule appointment.

## 2023-12-10 NOTE — Assessment & Plan Note (Deleted)
 Chronic left shoulder pain with limited range of motion. MRI confirmed impingement syndrome. - Refer to orthopedic specialist for further evaluation and management. - Contact Dr. Donaciano Sprang' office to schedule an appointment for left shoulder evaluation. - Inform the orthopedic office that the referral is for the left shoulder.

## 2023-12-10 NOTE — Assessment & Plan Note (Signed)
 Chronic left shoulder pain with limited range of motion. MRI confirmed impingement syndrome. - Refer to orthopedic specialist for further evaluation and management. - Contact Dr. Donaciano Sprang' office to schedule an appointment for left shoulder evaluation. - Inform the orthopedic office that the referral is for the left shoulder.

## 2023-12-10 NOTE — Assessment & Plan Note (Signed)
 Intermittent numbness in the right arm, likely related to carpal tunnel syndrome. Celebrex  effective in managing symptoms. - Consider using a wrist splint at night to alleviate symptoms. - Obtain a new wrist splint that is more comfortable for nighttime use.

## 2023-12-10 NOTE — Assessment & Plan Note (Signed)
 Blood pressure is well controlled. Continue current medications.

## 2023-12-14 ENCOUNTER — Encounter: Admitting: Sports Medicine

## 2023-12-18 ENCOUNTER — Encounter: Payer: Self-pay | Admitting: Radiology

## 2023-12-22 ENCOUNTER — Encounter: Payer: Self-pay | Admitting: Sports Medicine

## 2023-12-22 ENCOUNTER — Ambulatory Visit (INDEPENDENT_AMBULATORY_CARE_PROVIDER_SITE_OTHER): Admitting: Sports Medicine

## 2023-12-22 ENCOUNTER — Other Ambulatory Visit (INDEPENDENT_AMBULATORY_CARE_PROVIDER_SITE_OTHER): Payer: Self-pay

## 2023-12-22 DIAGNOSIS — M533 Sacrococcygeal disorders, not elsewhere classified: Secondary | ICD-10-CM | POA: Diagnosis not present

## 2023-12-22 DIAGNOSIS — M25512 Pain in left shoulder: Secondary | ICD-10-CM

## 2023-12-22 DIAGNOSIS — M7552 Bursitis of left shoulder: Secondary | ICD-10-CM | POA: Diagnosis not present

## 2023-12-22 DIAGNOSIS — Q796 Ehlers-Danlos syndrome, unspecified: Secondary | ICD-10-CM | POA: Diagnosis not present

## 2023-12-22 DIAGNOSIS — M899 Disorder of bone, unspecified: Secondary | ICD-10-CM | POA: Diagnosis not present

## 2023-12-22 DIAGNOSIS — G8929 Other chronic pain: Secondary | ICD-10-CM

## 2023-12-22 MED ORDER — LIDOCAINE HCL 1 % IJ SOLN
2.0000 mL | INTRAMUSCULAR | Status: AC | PRN
  Administered 2023-12-22: 2 mL

## 2023-12-22 MED ORDER — CELECOXIB 200 MG PO CAPS
200.0000 mg | ORAL_CAPSULE | Freq: Every day | ORAL | 0 refills | Status: AC
  Filled 2024-01-23 – 2024-03-18 (×3): qty 30, 30d supply, fill #0

## 2023-12-22 MED ORDER — METHYLPREDNISOLONE ACETATE 40 MG/ML IJ SUSP
40.0000 mg | INTRAMUSCULAR | Status: AC | PRN
  Administered 2023-12-22: 40 mg via INTRA_ARTICULAR

## 2023-12-22 MED ORDER — BUPIVACAINE HCL 0.25 % IJ SOLN
2.0000 mL | INTRAMUSCULAR | Status: AC | PRN
  Administered 2023-12-22: 2 mL via INTRA_ARTICULAR

## 2023-12-22 NOTE — Progress Notes (Signed)
 Marilyn Rhodes - 33 y.o. female MRN [id number]  Date of birth: 1991/01/06  Office Visit Note: Visit Date: 12/22/2023 PCP: Georgina Speaks, FNP Referred by: Georgina Speaks, FNP  Subjective: Chief Complaint  Patient presents with   Left Shoulder - Pain   HPI: Marilyn Rhodes is a pleasant 34 y.o. female who presents today for acute on chronic left shoulder pain.  Left shoulder -she has had issues with the shoulder for years.  MRI back in 2020 showed supraspinatus tendinosis with mild fraying although no tearing as well as a degree of subacromial bursitis.  This has came and went over the years but he recently over the last few weeks her pain has been significantly bothersome.  Having pain sleeping at night.  She has difficulty raising the shoulder as it feels somewhat stuck.  She is using Celebrex  200 mg once daily which helps her back and other joint pain but the shoulder pain persists.  EDS -she does have a Beighton score of 9/9.  Associated POTS.  She does follow with [facility]ology at [facility] and does have ultrasound screening examinations 1 to 2 years.  SI joint -the SI joint and the back continues to do quite well.  She did have SI joint injection and was involved in physical therapy back in June, stable at this point.  Pertinent ROS were reviewed with the patient and found to be negative unless otherwise specified above in HPI.   Assessment & Plan: Visit Diagnoses:  1. Chronic left shoulder pain   2. Subacromial bursitis of left shoulder joint   3. Scapular dysfunction   4. Ehlers-Danlos syndrome   5. SI (sacroiliac) joint dysfunction    Plan: Impression is acute on chronic left shoulder pain which has a history of supraspinatus tendinosis but has active subacromial bursitis.  This is likely from her underlying excessive joint hypermobility in the setting of EDS.  She has marked scapulothoracic dysfunction which puts her shoulder into a functional deficient state increasing pressure on the  rotator cuff and subacromial space in general.  Through shared decision making, given her pain that is interfering with sleep and ADLs we did perform subacromial joint injection.  Advised on postinjection protocol.  I would like to get her started in formalized physical therapy to help with her scapulothoracic dysfunction, her scapular and mobility and then work on strengthening of the rotator cuff following this, referral sent to PT today.  Her SI joint dysfunction is doing well, she will continue her HEP.  Did refill her Celebrex  200 mg which she will take once daily.  Did discuss given her EDS that she needs to continue following up with cardiology for routine screening.  I will see her back in 6 weeks to reevaluate.  Follow-up: Return in about 6 weeks (around 02/02/2024) for L-shoulder.   Meds & Orders:  Meds ordered this encounter  Medications   celecoxib  (CELEBREX ) 200 MG capsule    Sig: Take 1 capsule (200 mg total) by mouth daily.    Dispense:  30 capsule    Refill:  0    Orders Placed This Encounter  Procedures   Large Joint Inj   XR Shoulder Left     Procedures: Large Joint Inj: L subacromial bursa on 12/22/2023 9:01 AM Indications: pain Details: 22 G 1.5 in needle, posterior approach Medications: 2 mL lidocaine 1 %; 2 mL bupivacaine 0.25 %; 40 mg methylPREDNISolone acetate 40 MG/ML Outcome: tolerated well, no immediate complications  Subacromial Joint Injection, Left Shoulder After  discussion on risks/benefits/indications, informed verbal consent was obtained. A timeout was then performed. Patient was seated on table in exam room. The patient's shoulder was prepped with betadine and alcohol swabs and utilizing posterior approach a 22G, 1.5 needle was directed anteriorly and laterally into the patient's subacromial space was injected with 2:2:1 mixture of lidocaine:bupivicaine:depomedrol with appreciation of free-flowing of the injectate into the bursal space. Patient tolerated  the procedure well without immediate complications.   Procedure, treatment alternatives, risks and benefits explained, specific risks discussed. Consent was given by the patient. Immediately prior to procedure a time out was called to verify the correct patient, procedure, equipment, support staff and site/side marked as required. Patient was prepped and draped in the usual sterile fashion.          Clinical History: No specialty comments available.  She reports that she has never smoked. She has never been exposed to tobacco smoke. She has never used smokeless tobacco. No results for input(s): HGBA1C, LABURIC in the last 8760 hours.  Objective:   Vital Signs: LMP 11/16/2023   Physical Exam  Gen: Well-appearing, in no acute distress; non-toxic CV: Well-perfused. Warm.  Resp: Breathing unlabored on room air; no wheezing. Psych: Fluid speech in conversation; appropriate affect; normal thought process  Ortho Exam - Left shoulder: There is at least a mild degree of subacromial bursitis present.  There is pain with limitation in active range of motion with endrange flexion and abduction.  Positive empty can testing.  There is scapulothoracic dysfunction which limits her overhead motion.  There is a degree of scapular dysfunction on the left side without scapular winging present as well.  Imaging:  *I did review left shoulder MRI from [facility] on 07/30/2018, results report noted with tendinosis of the supraspinatus tendon with interstitial fraying without tear.  Mild subacromial/subdeltoid bursitis.  Narrative  CLINICAL DATA:  Left arm pain with drooping for over a year. No trauma. Weakness. Pain goes all the way down to fingers. Just woke up one day with shoulder pain  EXAM: MRI OF THE LEFT SHOULDER WITHOUT CONTRAST  TECHNIQUE: Multiplanar, multisequence MR imaging of the shoulder was performed. No intravenous contrast was administered.  COMPARISON:   None.  FINDINGS: Rotator cuff: Moderate tendinosis of the supraspinatus tendon with fraying along the bursal surface. Infraspinatus tendon is intact. Teres minor tendon is intact. Subscapularis tendon is intact.  Muscles: No atrophy or fatty replacement of nor abnormal signal within, the muscles of the rotator cuff.  Biceps long head:  Intact.  Acromioclavicular Joint: Mild arthropathy of the acromioclavicular joint. Type II acromion. Small amount of subacromial/subdeltoid bursal fluid.  Glenohumeral Joint: No joint effusion.  No chondral defect.  Labrum: Grossly intact, but evaluation is limited by lack of intraarticular fluid.  Bones:  No marrow abnormality, fracture or dislocation.  Other: No fluid collection or hematoma.  IMPRESSION: 1. Moderate tendinosis of the supraspinatus tendon with fraying along the bursal surface. 2. Mild subacromial/subdeltoid bursitis.   Electronically Signed   By: Julaine Blanch   On: 07/31/2018 09:45  Past Medical/Family/Surgical/Social History: Medications & Allergies reviewed per EMR, new medications updated. Patient Active Problem List   Diagnosis Date Noted   Right arm numbness 11/28/2023   Anxiety and depression 11/05/2023   Class 1 obesity due to excess calories with body mass index (BMI) of 30.0 to 30.9 in adult 11/05/2023   Establishing care with new doctor, encounter for 10/12/2023   Encounter for lipid screening for cardiovascular disease 10/12/2023  Anxiety 10/12/2023   Chronic right shoulder pain 10/12/2023   Insomnia 10/12/2023   Acute non intractable tension-type headache 10/02/2023   Irritable bowel syndrome with diarrhea 07/29/2019   Impingement syndrome of left shoulder region 09/01/2018   Paresthesia of hand 06/27/2018   Pain in right hand 06/26/2018   Mild intermittent asthma without complication 10/29/2017   Family history of factor V deficiency 10/29/2017   Family history of DVT 10/29/2017   Essential  hypertension 10/27/2017   Implantable loop recorder present 09/12/2017   Hypermobile joints 09/12/2017   Chronic bilateral thoracic back pain 01/20/2017   SVT (supraventricular tachycardia) 02/03/2015   POTS (postural orthostatic tachycardia syndrome) 07/03/2013   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)    Benign positional vertigo    Bilateral shoulder pain    Bilateral thoracic back pain    Carpal tunnel syndrome    Cough    Dizziness    Dysmenorrhea    ECG abnormal    Fatigue    Flu    History of degenerative disc disease    Hypercholesteremia    Hypomagnesemia    Iron deficiency    Lower respiratory tract infection    Lumbar back pain    Muscle spasm of back    Myalgia    Nausea    Oligomenorrhea    Palpitations    Pneumonia, bacterial    Pre-diabetes    Right wrist pain    SOB (shortness of breath) on exertion    Vitamin D  deficiency    Family History  Problem Relation Age of Onset   Endometriosis Mother    Factor VIII deficiency Father    Endometriosis Maternal Grandmother    Heart failure Paternal Grandmother    Parkinson's disease Paternal Grandmother    Hypertension Paternal Grandmother    Diabetes Paternal Grandmother    Past Surgical History:  Procedure Laterality Date   ABLATION     LOOP RECORDER INSERTION     Social History   Occupational History   Not on file  Tobacco Use   Smoking status: Never    Passive exposure: Never   Smokeless tobacco: Never  Vaping Use   Vaping status: Never Used  Substance and Sexual Activity   Alcohol use: Not Currently   Drug use: Not Currently   Sexual activity: Not on file

## 2023-12-22 NOTE — Progress Notes (Signed)
 Patient says that she has had left shoulder pain for a couple of years with no known injury. She has had the shoulder evaluated in the past and did have an MRI done at that time. She says that she did physical therapy and did not get any pain relief; she has not continued to do any of her exercises as they did not seem to be helpful. She denies any popping or clicking, and any numbness or tingling in the left arm. Patient does not take any medicine for this pain specifically, but she has been taking her Celebrex  for her back. She is right hand dominant, and says that the left shoulder seems to droop compared to the right shoulder. She has the most pain with overhead motion.

## 2023-12-25 ENCOUNTER — Ambulatory Visit: Payer: Self-pay | Admitting: Nurse Practitioner

## 2024-01-01 ENCOUNTER — Ambulatory Visit: Admitting: Physical Therapy

## 2024-01-01 NOTE — Therapy (Incomplete)
 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)    Benign positional vertigo    Bilateral shoulder pain    Bilateral thoracic back pain    Carpal tunnel syndrome    Cough    Dizziness    Dysmenorrhea    ECG abnormal    Fatigue    Flu    History of degenerative disc disease    Hypercholesteremia    Hypomagnesemia    Iron deficiency    Lower respiratory tract infection    Lumbar back pain    Muscle spasm of back    Myalgia    Nausea    Oligomenorrhea    Palpitations    Pneumonia, bacterial    Pre-diabetes    Right wrist pain    SOB (shortness of breath) on exertion    Vitamin D  deficiency    Past Surgical History:  Procedure Laterality Date   ABLATION     LOOP RECORDER INSERTION     Patient Active Problem List   Diagnosis Date Noted   Right arm numbness 11/28/2023   Anxiety and depression 11/05/2023   Class 1 obesity due to excess calories with body mass index (BMI) of 30.0 to 30.9 in adult 11/05/2023   Establishing care with new doctor, encounter for 10/12/2023   Encounter for lipid screening for cardiovascular disease 10/12/2023   Anxiety 10/12/2023   Chronic right shoulder pain 10/12/2023   Insomnia 10/12/2023   Acute non intractable tension-type headache 10/02/2023   Irritable bowel syndrome with diarrhea 07/29/2019   Impingement syndrome of left shoulder region 09/01/2018   Paresthesia of hand 06/27/2018   Pain in right hand 06/26/2018   Mild intermittent asthma without complication 10/29/2017   Family history of factor V deficiency 10/29/2017   Family history of DVT 10/29/2017   Essential hypertension 10/27/2017   Implantable loop recorder present 09/12/2017   Hypermobile joints 09/12/2017    Chronic bilateral thoracic back pain 01/20/2017   SVT (supraventricular tachycardia) 02/03/2015   POTS (postural orthostatic tachycardia syndrome) 07/03/2013    PCP: Georgina Speaks, FNP   REFERRING PROVIDER: Burnetta Brunet, DO   REFERRING DIAG:  Diagnosis  M25.512,G89.29 (ICD-10-CM) - Chronic left shoulder pain  M75.52 (ICD-10-CM) - Subacromial bursitis of left shoulder joint  M89.9 (ICD-10-CM) - Scapular dysfunction  Q79.60 (ICD-10-CM) - Ehlers-Danlos syndrome    THERAPY DIAG:    Rationale for Evaluation and Treatment: Rehabilitation  ONSET DATE: ***  SUBJECTIVE:  SUBJECTIVE STATEMENT: ***  PERTINENT HISTORY: POTS (positional vertigo), EDS, h/o low back pain. 2 ligaments torn in Rt ankle years ago  PAIN:  NPRS scale: ***/10 Pain location: *** Pain description: *** Aggravating factors: *** Relieving factors: ***  PRECAUTIONS: Other: EDS, POTS  WEIGHT BEARING RESTRICTIONS: No  FALLS:  Has patient fallen in last 6 months? {fallsyesno:27318}  LIVING ENVIRONMENT: Lives with: {[facility] lives with:25569::lives with their family} Lives in: {Lives in:[address]} Stairs: {opstairs:27293} Has following equipment at home: {Assistive devices:23999}  OCCUPATION: ***  PLOF: Independent  PATIENT GOALS:***  Next MD visit:   OBJECTIVE:   DIAGNOSTIC FINDINGS:  2022 Lumbar CT IMPRESSION: Negative CT of the lumbar spine. No acute or healing fracture. No discrete disc disease or stenosis.   XR 06/23/2023: 2 views of the right and left hip including AP and lateral film were  ordered and reviewed by myself.  X-rays demonstrate femoral head  well-seated within the acetabulum.  There is no significant joint space  narrowing or arthritic change.  No acute fractures or otherwise acute bony  abnormality  noted.   XR 06/23/2023: 2 views of the lumbar spine including AP and lateral film were ordered and  reviewed by myself.  There is very early DDD at L3-L4 and L4-L5 level.  No  significant arthritic change or spurring.  No listhesis.  PATIENT SURVEYS:  Patient-Specific Activity Scoring Scheme  0 represents "unable to perform." 10 represents "able to perform at prior level. 0 1 2 3 4 5 6 7 8 9  10 (Date and Score)   Activity Eval  01/01/24    1. ***      2. ***      3. ***    4.    5.    Score ***    Total score = sum of the activity scores/number of activities Minimum detectable change (90%CI) for average score = 2 points Minimum detectable change (90%CI) for single activity score = 3 points  COGNITION: Overall cognitive status: WFL     SENSATION: WFL  POSTURE: ***  UPPER EXTREMITY ROM:   ROM Right Eval 01/01/24 Left Eval 01/01/24  Shoulder flexion    Shoulder extension    Shoulder abduction    Shoulder adduction    Shoulder internal rotation    Shoulder external rotation    Elbow flexion    Elbow extension    Wrist flexion    Wrist extension    Wrist ulnar deviation    Wrist radial deviation    Wrist pronation    Wrist supination    (Blank rows = not tested)  UPPER EXTREMITY MMT:  MMT Right Eval 01/01/24 Left Eval 01/01/24  Shoulder flexion    Shoulder extension    Shoulder abduction    Shoulder adduction    Shoulder internal rotation    Shoulder external rotation    Middle trapezius    Lower trapezius    Elbow flexion    Elbow extension    Wrist flexion    Wrist extension    Wrist ulnar deviation    Wrist radial deviation    Wrist pronation    Wrist supination    Grip strength (lbs)    (Blank rows = not tested)  SHOULDER SPECIAL TESTS: Impingement tests: {shoulder impingement test:25231:a} SLAP lesions: {SLAP lesions:25232} Instability tests: {shoulder instability test:25233} Rotator cuff assessment: {rotator cuff  assessment:25234} Biceps assessment: {biceps assessment:25235}  JOINT MOBILITY TESTING:  ***  PALPATION:  ***  TODAY'S TREATMENT:                                                                                                       DATE: 01/01/24 Therex:    HEP instruction/performance c cues for techniques, handout provided.  Trial set performed of each for comprehension and symptom assessment.  See below for exercise list   PATIENT EDUCATION: Education details: HEP, POC Person educated: Patient Education method: Explanation, Demonstration, Verbal cues, and Handouts Education comprehension: verbalized understanding, returned demonstration, and verbal cues required  HOME EXERCISE PROGRAM: ***  ASSESSMENT:  CLINICAL IMPRESSION: Patient is a 33 y.o. who comes to clinic with complaints of ***pain with mobility, strength and movement coordination deficits that impair their ability to perform usual daily and recreational functional activities without increase difficulty/symptoms at this time.  Patient to benefit from skilled PT services to address impairments and limitations to improve to previous level of function without restriction secondary to condition.   OBJECTIVE IMPAIRMENTS: {opptimpairments:25111}.   ACTIVITY LIMITATIONS: {activitylimitations:27494}  PARTICIPATION LIMITATIONS: {participationrestrictions:25113}  PERSONAL FACTORS: 3+ comorbidities: see PMH above are also affecting patient's functional outcome.   REHAB POTENTIAL: Good  CLINICAL DECISION MAKING: Stable/uncomplicated  EVALUATION COMPLEXITY: Moderate   GOALS: Goals reviewed with patient? Yes  SHORT TERM GOALS: (target date for Short term goals are 3 weeks 01/22/2024)  1.Patient will demonstrate independent use of  home exercise program to maintain progress from in clinic treatments. Goal status: New  LONG TERM GOALS: (target dates for all long term goals are 10 weeks 03/11/2024 )   1. Patient will demonstrate/report pain at worst less than or equal to 2/10 to facilitate minimal limitation in daily activity secondary to pain symptoms. Goal status: New   2. Patient will demonstrate independent use of home exercise program to facilitate ability to maintain/progress functional gains from skilled physical therapy services. Goal status: New   3. Patient will demonstrate Patient specific functional scale avg > or = *** to indicate reduced disability due to condition.  Goal status: New   4.  Patient will demonstrate *** UE MMT 5/5 throughout to facilitate lifting, reaching, carrying at [facility] in daily activity.   Goal status: New   5.  2.  Patient will be able to demonstrate proper posture and lifting techniques related bilateral UE's as well as spine health and reduction of symptoms.    Goal status: New   6.  *** Goal status: New   7.  *** Goal Status: New  PLAN:  PT FREQUENCY: 1-2x/week  PT DURATION: 10 weeks  PLANNED INTERVENTIONS: Can include 02853- PT Re-evaluation, 97110-Therapeutic exercises, 97530- Therapeutic activity, [id number]- Neuromuscular re-education, 97535- Self Care, 97140- Manual therapy, [phone]- Gait training, [phone]- Orthotic Fit/training, [phone]- Canalith repositioning, [id number]- Aquatic Therapy, [phone]- Electrical stimulation (unattended), [id number] Physical performance testing, 97016- Vasopneumatic device, [id number]- Ultrasound, [id number]- Traction (mechanical), [id number]- Ionotophoresis 4mg /ml Dexamethasone ,  79439 - Needle insertion w/o injection 1 or 2 muscles, 20561 - Needle insertion w/o injection 3 or more muscles.   Patient/Family education, Balance training, Stair training, Taping, Dry Needling, Joint mobilization, Joint manipulation, Spinal  manipulation, Spinal mobilization, Scar mobilization,  Vestibular training, Visual/preceptual remediation/compensation, DME instructions, Cryotherapy, and Moist heat.  All performed as medically necessary.  All included unless contraindicated  PLAN FOR NEXT SESSION: Review HEP knowledge/results.      Delon JONELLE Lunger, PT,MPT 01/01/2024, 7:53 AM

## 2024-01-23 ENCOUNTER — Other Ambulatory Visit (HOSPITAL_COMMUNITY): Payer: Self-pay

## 2024-01-24 ENCOUNTER — Ambulatory Visit: Admitting: Rehabilitative and Restorative Service Providers"

## 2024-01-24 NOTE — Therapy (Incomplete)
 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)    Benign positional vertigo    Bilateral shoulder pain    Bilateral thoracic back pain    Carpal tunnel syndrome    Cough    Dizziness    Dysmenorrhea    ECG abnormal    Fatigue    Flu    History of degenerative disc disease    Hypercholesteremia    Hypomagnesemia    Iron deficiency    Lower respiratory tract infection    Lumbar back pain    Muscle spasm of back    Myalgia    Nausea    Oligomenorrhea    Palpitations    Pneumonia, bacterial    Pre-diabetes    Right wrist pain    SOB (shortness of breath) on exertion    Vitamin D  deficiency    Past Surgical History:  Procedure Laterality Date   ABLATION     LOOP RECORDER INSERTION     Patient Active Problem List   Diagnosis Date Noted   Right arm numbness 11/28/2023   Anxiety and depression 11/05/2023   Class 1 obesity due to excess calories with body mass index (BMI) of 30.0 to 30.9 in adult 11/05/2023   Establishing care with new doctor, encounter for 10/12/2023   Encounter for lipid screening for cardiovascular disease 10/12/2023   Anxiety 10/12/2023   Chronic right shoulder pain 10/12/2023   Insomnia 10/12/2023   Acute non intractable tension-type headache 10/02/2023   Irritable bowel syndrome with diarrhea 07/29/2019   Impingement syndrome of left shoulder region 09/01/2018   Paresthesia of hand 06/27/2018   Pain in right hand 06/26/2018   Mild intermittent asthma without complication 10/29/2017   Family history of factor V deficiency 10/29/2017   Family history of DVT 10/29/2017   Essential hypertension 10/27/2017   Implantable loop recorder present 09/12/2017   Hypermobile joints 09/12/2017   Chronic bilateral  thoracic back pain 01/20/2017   SVT (supraventricular tachycardia) 02/03/2015   POTS (postural orthostatic tachycardia syndrome) 07/03/2013    PCP: Georgina Speaks FNP  REFERRING PROVIDER: Burnetta Brunet, DO  REFERRING DIAG: [phone] (ICD-10-CM) - Chronic left shoulder pain M75.52 (ICD-10-CM) - Subacromial bursitis of left shoulder joint M89.9 (ICD-10-CM) - Scapular dysfunction Q79.60 (ICD-10-CM) - Ehlers-Danlos syndrome  THERAPY DIAG:  No diagnosis found.  Rationale for Evaluation and Treatment: Rehabilitation  ONSET DATE: ***  SUBJECTIVE:  SUBJECTIVE STATEMENT: ***  PERTINENT HISTORY: Medical history: vertigo, CTS, thoracic pain, shoulder pain history, dizziness, DDD, hypercholesteremia, lumbar pain, myalgia, nausea, oligomenorrhea, SOB  PAIN:  NPRS scale: ***/10 Pain location: *** Pain description: *** Aggravating factors: *** Relieving factors: ***  PRECAUTIONS: None  WEIGHT BEARING RESTRICTIONS: No  FALLS:  Has patient fallen in last 6 months? No  LIVING ENVIRONMENT: Lives with: {[facility] lives with:25569::lives with their family} Lives in: {Lives in:[address]} Stairs: {opstairs:27293} Has following equipment at home: {Assistive devices:23999}  OCCUPATION: ***  PLOF: Independent  PATIENT GOALS:***  Next MD visit:   OBJECTIVE:   DIAGNOSTIC FINDINGS:  ***  PATIENT SURVEYS:  Patient-Specific Activity Scoring Scheme  0 represents unable to perform. 10 represents able to perform at prior level. 0 1 2 3 4 5 6 7 8 9  10 (Date and Score)   Activity Eval  01/25/2024    1. ***      2. ***      3. ***    4.    5.    Score ***    Total score = sum of the activity scores/number of activities Minimum detectable change (90%CI) for average score = 2 points Minimum  detectable change (90%CI) for single activity score = 3 points  COGNITION: 01/25/2024 Overall cognitive status: WFL     SENSATION: 01/25/2024 {sensation:27233}  POSTURE: 01/25/2024 ***  UPPER EXTREMITY ROM:   ROM Right Eval 01/25/2024 Left Eval 01/25/2024  Shoulder flexion    Shoulder extension    Shoulder abduction    Shoulder adduction    Shoulder internal rotation    Shoulder external rotation    Elbow flexion    Elbow extension    Wrist flexion    Wrist extension    Wrist ulnar deviation    Wrist radial deviation    Wrist pronation    Wrist supination    (Blank rows = not tested)  UPPER EXTREMITY MMT:  MMT Right Eval 01/25/2024 Left Eval 01/25/2024  Shoulder flexion    Shoulder extension    Shoulder abduction    Shoulder adduction    Shoulder internal rotation    Shoulder external rotation    Middle trapezius    Lower trapezius    Elbow flexion    Elbow extension    Wrist flexion    Wrist extension    Wrist ulnar deviation    Wrist radial deviation    Wrist pronation    Wrist supination    Grip strength (lbs)    (Blank rows = not tested)  SHOULDER SPECIAL TESTS: 01/25/2024 Impingement tests: {shoulder impingement test:25231:a} SLAP lesions: {SLAP lesions:25232} Instability tests: {shoulder instability test:25233} Rotator cuff assessment: {rotator cuff assessment:25234} Biceps assessment: {biceps assessment:25235}  JOINT MOBILITY TESTING:  01/25/2024 ***  PALPATION:  01/25/2024 ***  TODAY'S TREATMENT:                                                                                                       DATE: 01/25/2024 Therex:    HEP instruction/performance c cues for techniques, handout provided.  Trial set performed of each for comprehension  and symptom assessment.  See below for exercise list   PATIENT EDUCATION: 01/25/2024 Education details: HEP, POC Person educated: Patient Education method: Programmer, Multimedia, Demonstration, Verbal cues, and Handouts Education comprehension: verbalized understanding, returned demonstration, and verbal cues required  HOME EXERCISE PROGRAM: ***  ASSESSMENT:  CLINICAL IMPRESSION: Patient is a 33 y.o. who comes to clinic with complaints of ***pain with mobility, strength and movement coordination deficits that impair their ability to perform usual daily and recreational functional activities without increase difficulty/symptoms at this time.  Patient to benefit from skilled PT services to address impairments and limitations to improve to previous level of function without restriction secondary to condition.   OBJECTIVE IMPAIRMENTS: {opptimpairments:25111}.   ACTIVITY LIMITATIONS: {activitylimitations:27494}  PARTICIPATION LIMITATIONS: {participationrestrictions:25113}  PERSONAL FACTORS: Time since onset of injury/illness/exacerbation and Medical history: vertigo, CTS, thoracic pain, shoulder pain history, dizziness, DDD, hypercholesteremia, lumbar pain, myalgia, nausea, oligomenorrhea, SOB are also affecting patient's functional outcome.   REHAB POTENTIAL: {rehabpotential:25112}  CLINICAL DECISION MAKING: {clinical decision making:25114}  EVALUATION COMPLEXITY: {Evaluation complexity:25115}   GOALS: Goals reviewed with patient? Yes  SHORT TERM GOALS: (target date for Short term goals are 3 weeks 02/15/2024)  1.Patient will demonstrate independent use of home exercise program to maintain progress from in clinic treatments. Goal status: New  LONG TERM GOALS: (target dates for all long term goals are 10 weeks  04/04/2024 )   1. Patient will demonstrate/report pain at worst less than or equal to 2/10 to facilitate minimal limitation in daily activity secondary to pain symptoms. Goal status:  New   2. Patient will demonstrate independent use of home exercise program to facilitate ability to maintain/progress functional gains from skilled physical therapy services. Goal status: New   3. Patient will demonstrate Patient specific functional scale avg > or = 8/10 to indicate reduced disability due to condition.  Goal status: New   4.  Patient will demonstrate Lt UE MMT 5/5 throughout to facilitate lifting, reaching, carrying at [facility] in daily activity.   Goal status: New   5.  Patient will demonstrate Lt [facility] joint AROM WFL s symptoms to facilitate usual overhead reaching, self care, dressing at PLOF.    Goal status: New   6.  *** Goal status: New   7.  *** Goal Status: New  PLAN:  PT FREQUENCY: 1-2x/week  PT DURATION: 10 weeks  PLANNED INTERVENTIONS: Can include 02853- PT Re-evaluation, 97110-Therapeutic exercises, 97530- Therapeutic activity, [id number]- Neuromuscular re-education, 97535- Self Care, 97140- Manual therapy, [id number]- Gait training, [phone]- Orthotic Fit/training, [phone]- Canalith repositioning, [id number]- Aquatic Therapy, [phone]- Electrical stimulation (unattended), [id number] Physical performance testing, 97016- Vasopneumatic device, [id number]- Ultrasound, [id number]- Traction (mechanical), [id number]- Ionotophoresis 4mg /ml Dexamethasone ,  79439 - Needle insertion w/o injection 1 or 2 muscles, 79438 - Needle insertion w/o injection  3 or more muscles.   Patient/Family education, Balance training, Stair training, Taping, Dry Needling, Joint mobilization, Joint manipulation, Spinal manipulation, Spinal mobilization, Scar mobilization, Vestibular training, Visual/preceptual remediation/compensation, DME instructions, Cryotherapy, and Moist heat.  All performed as medically necessary.  All included unless contraindicated  PLAN FOR NEXT SESSION: Review HEP knowledge/results.    Ozell Silvan, PT, DPT, OCS, ATC 01/24/24  1:59 PM

## 2024-01-25 ENCOUNTER — Encounter: Payer: Self-pay | Admitting: Nurse Practitioner

## 2024-01-25 ENCOUNTER — Ambulatory Visit: Admitting: Rehabilitative and Restorative Service Providers"

## 2024-01-25 ENCOUNTER — Telehealth: Admitting: Nurse Practitioner

## 2024-01-25 DIAGNOSIS — E559 Vitamin D deficiency, unspecified: Secondary | ICD-10-CM

## 2024-01-25 DIAGNOSIS — G90A Postural orthostatic tachycardia syndrome (POTS): Secondary | ICD-10-CM

## 2024-01-25 DIAGNOSIS — F418 Other specified anxiety disorders: Secondary | ICD-10-CM

## 2024-01-25 DIAGNOSIS — I10 Essential (primary) hypertension: Secondary | ICD-10-CM

## 2024-01-25 DIAGNOSIS — Z139 Encounter for screening, unspecified: Secondary | ICD-10-CM

## 2024-01-25 DIAGNOSIS — F5101 Primary insomnia: Secondary | ICD-10-CM

## 2024-01-25 DIAGNOSIS — F32A Depression, unspecified: Secondary | ICD-10-CM

## 2024-01-25 MED ORDER — BUSPIRONE HCL 10 MG PO TABS: 10.0000 mg | ORAL_TABLET | Freq: Two times a day (BID) | ORAL | 1 refills | Status: AC

## 2024-01-25 MED ORDER — BUSPIRONE HCL 10 MG PO TABS: 10.0000 mg | ORAL_TABLET | Freq: Two times a day (BID) | ORAL | 1 refills | Status: DC

## 2024-01-25 NOTE — Progress Notes (Addendum)
 "  Virtual Visit via Video Note  [name], CMA,acting as a scribe for Gaines Ada, FNP.,have documented all relevant documentation on the behalf of Gaines Ada, FNP,as directed by  Gaines Ada, FNP while in the presence of Gaines Ada, FNP.  I connected with Kip Sharps on 02/04/2024 at  9:20 AM EST by a video enabled telemedicine application and verified that I am speaking with the correct person using two identifiers.  Patient Location: Home Provider Location: Other:     I discussed the limitations, risks, security, and privacy concerns of performing an evaluation and management service by video and the availability of in person appointments. I also discussed with the patient that there may be a patient responsible charge related to this service. The patient expressed understanding and agreed to proceed.  Subjective: PCP: Ada Gaines, FNP  Chief Complaint  Patient presents with   Hypertension    Patient presents today for a bp follow up, Patient reports compliance with medication.    Headache    Patient states she had a massive headache last night that started in the back of her neck that went around her whole head she states the pain shoot down her right shoulder she reports her blood was checked last night and it was 125/85 during that time.    Pain was shooting from the back of her neck around to the top of her head. She took 3 ibuprofen and 2 excedrin, waited for 2 hours. She was unable to work properly.   She does not feel the buspirone  is working will increase the dose. Message sent to [facility] about appt  Hypertension This is a chronic problem. The current episode started more than 1 year ago. The problem has been gradually worsening since onset. The problem is controlled. Associated symptoms include headaches. Pertinent negatives include no anxiety or shortness of breath. There are no associated agents to hypertension. Risk factors for coronary artery disease  include obesity and sedentary lifestyle. Past treatments include beta blockers. There is no history of kidney disease or CVA. There is no history of chronic renal disease.    ROS: Per HPI Current Medications[1]  Observations/Objective: There were no vitals filed for this visit. Physical Exam Vitals reviewed.  Constitutional:      General: She is not in acute distress.    Appearance: She is well-developed. She is obese.  Neurological:     Mental Status: She is alert.  Psychiatric:        Mood and Affect: Mood normal. Mood is not anxious.        Speech: Speech normal.        Behavior: Behavior normal.     Assessment and Plan: Essential hypertension Assessment & Plan: Blood pressure is well controlled. Continue current medications  Orders: -     BMP8+eGFR; Future  Primary insomnia  Anxiety and depression Assessment & Plan: Does not feel the buspirone  is working effectively. Will increase the dose to 10 mg two times daily. Continue f/u with Christina.   Orders: -     busPIRone  HCl; Take 1 tablet (10 mg total) by mouth 2 (two) times daily.  Dispense: 180 tablet; Refill: 1  POTS (postural orthostatic tachycardia syndrome) Assessment & Plan: No issues   Vitamin D  deficiency Assessment & Plan: Will check vitamin D  level and supplement as needed.    Also encouraged to spend 15 minutes in the sun daily.    Orders: -     VITAMIN D  25 Hydroxy (  Vit-D Deficiency, Fractures); Future  Encounter for screening -     Hepatitis B surface antibody,qualitative; Future    Follow Up Instructions: Return for 6 month bp check.   Time spent on call approximately 12 minutes.   I discussed the assessment and treatment plan with the patient. The patient was provided an opportunity to ask questions, and all were answered. The patient agreed with the plan and demonstrated an understanding of the instructions.   The patient was advised to call back or seek an in-person evaluation if  the symptoms worsen or if the condition fails to improve as anticipated.  The above assessment and management plan was discussed with the patient. The patient verbalized understanding of and has agreed to the management plan.   LILLETTE Gaines Ada, FNP, have reviewed all documentation for this visit. The documentation on 01/25/2024 for the exam, diagnosis, procedures, and orders are all accurate and complete.      [1]  Current Outpatient Medications:    atenolol  (TENORMIN ) 25 MG tablet, Take 1 tablet (25 mg total) by mouth daily., Disp: 90 tablet, Rfl: 1   celecoxib  (CELEBREX ) 200 MG capsule, Take 1 capsule (200 mg total) by mouth daily., Disp: 30 capsule, Rfl: 0   etodolac (LODINE XL) 400 MG 24 hr tablet, Take 400 mg by mouth daily as needed. , Disp: , Rfl:    methocarbamol  (ROBAXIN ) 500 MG tablet, Take 1 tablet (500 mg total) by mouth 2 (two) times daily., Disp: 20 tablet, Rfl: 0   midodrine (PROAMATINE) 2.5 MG tablet, Take 2.5 mg by mouth 3 (three) times daily with meals., Disp: , Rfl:    promethazine -dextromethorphan (PROMETHAZINE -DM) 6.25-15 MG/5ML syrup, Take 5 mLs by mouth 3 (three) times daily as needed for cough., Disp: 200 mL, Rfl: 0   Pseudoeph-Doxylamine-DM-APAP (NYQUIL PO), Take by mouth., Disp: , Rfl:    traZODone  (DESYREL ) 100 MG tablet, Take 1 tablet (100 mg total) by mouth at bedtime as needed for sleep., Disp: 30 tablet, Rfl: 2   Vitamin D , Ergocalciferol , (DRISDOL ) 1.25 MG (50000 UNIT) CAPS capsule, Take 1 capsule (50,000 Units total) by mouth every 7 (seven) days., Disp: 12 capsule, Rfl: 0   busPIRone  (BUSPAR ) 10 MG tablet, Take 1 tablet (10 mg total) by mouth 2 (two) times daily., Disp: 180 tablet, Rfl: 1   ondansetron  (ZOFRAN -ODT) 4 MG disintegrating tablet, Take 1 tablet (4 mg total) by mouth every 8 (eight) hours as needed for nausea or vomiting. (Patient not taking: Reported on 01/25/2024), Disp: 20 tablet, Rfl: 0  "

## 2024-01-26 ENCOUNTER — Other Ambulatory Visit: Payer: Self-pay

## 2024-01-27 ENCOUNTER — Telehealth: Admitting: Family Medicine

## 2024-01-27 DIAGNOSIS — R519 Headache, unspecified: Secondary | ICD-10-CM | POA: Diagnosis not present

## 2024-01-27 NOTE — Patient Instructions (Signed)
 Marilyn Rhodes, thank you for joining Roosvelt Mater, PA-C for today's virtual visit.  While this provider is not your primary care provider (PCP), if your PCP is located in our provider database this encounter information will be shared with them immediately following your visit.   A [facility] MyChart account gives you access to today's visit and all your visits, tests, and labs performed at [facility]  click here if you don't have a [facility] MyChart account or go to [email or web address]  Consent: (Patient) Marilyn Rhodes provided verbal consent for this virtual visit at the beginning of the encounter.  Current Medications:  Current Outpatient Medications:    atenolol  (TENORMIN ) 25 MG tablet, Take 1 tablet (25 mg total) by mouth daily., Disp: 90 tablet, Rfl: 1   busPIRone  (BUSPAR ) 10 MG tablet, Take 1 tablet (10 mg total) by mouth 2 (two) times daily., Disp: 180 tablet, Rfl: 1   celecoxib  (CELEBREX ) 200 MG capsule, Take 1 capsule (200 mg total) by mouth daily., Disp: 30 capsule, Rfl: 0   etodolac (LODINE XL) 400 MG 24 hr tablet, Take 400 mg by mouth daily as needed. , Disp: , Rfl:    methocarbamol  (ROBAXIN ) 500 MG tablet, Take 1 tablet (500 mg total) by mouth 2 (two) times daily., Disp: 20 tablet, Rfl: 0   midodrine (PROAMATINE) 2.5 MG tablet, Take 2.5 mg by mouth 3 (three) times daily with meals., Disp: , Rfl:    ondansetron  (ZOFRAN -ODT) 4 MG disintegrating tablet, Take 1 tablet (4 mg total) by mouth every 8 (eight) hours as needed for nausea or vomiting. (Patient not taking: Reported on 01/25/2024), Disp: 20 tablet, Rfl: 0   promethazine -dextromethorphan (PROMETHAZINE -DM) 6.25-15 MG/5ML syrup, Take 5 mLs by mouth 3 (three) times daily as needed for cough., Disp: 200 mL, Rfl: 0   Pseudoeph-Doxylamine-DM-APAP (NYQUIL PO), Take by mouth., Disp: , Rfl:    traZODone  (DESYREL ) 100 MG tablet, Take 1 tablet (100 mg total) by mouth at bedtime as needed for sleep., Disp: 30 tablet,  Rfl: 2   Vitamin D , Ergocalciferol , (DRISDOL ) 1.25 MG (50000 UNIT) CAPS capsule, Take 1 capsule (50,000 Units total) by mouth every 7 (seven) days., Disp: 12 capsule, Rfl: 0   Medications ordered in this encounter:  No orders of the defined types were placed in this encounter.    *If you need refills on other medications prior to your next appointment, please contact your pharmacy*  Follow-Up: Call back or seek an in-person evaluation if the symptoms worsen or if the condition fails to improve as anticipated.  [facility] [phone]  Other Instructions General Headache Without Cause A headache is pain or discomfort you feel around the head or neck area. There are many causes and types of headaches. In some cases, the cause may not be found. Follow these instructions at home: Watch your condition for any changes. Let your doctor know about them. Take these steps to help with your condition: Managing pain     Take over-the-counter and prescription medicines only as told by your doctor. This includes medicines for pain that are taken by mouth or put on the skin. Lie down in a dark, quiet room when you have a headache. If told, put ice on your head and neck area: Put ice in a plastic bag. Place a towel between your skin and the bag. Leave the ice on for 20 minutes, 2-3 times per day. Take off the ice if your skin turns bright red. This is very important.  If you cannot feel pain, heat, or cold, you have a greater risk of damage to the area. If told, put heat on the affected area. Use the heat source that your doctor recommends, such as a moist heat pack or a heating pad. Place a towel between your skin and the heat source. Leave the heat on for 20-30 minutes. Take off the heat if your skin turns bright red. This is very important. If you cannot feel pain, heat, or cold, you have a greater risk of getting burned. Keep lights dim if bright lights bother you or make your  headaches worse. Eating and drinking Eat meals on a regular schedule. If you drink alcohol: Limit how much you have to: 0-1 drink a day for women who are not pregnant. 0-2 drinks a day for men. Know how much alcohol is in a drink. In the [place] one drink equals one 12 oz bottle of beer (355 mL), one 5 oz glass of wine (148 mL), or one 1 oz glass of hard liquor (44 mL). Stop drinking caffeine, or drink less caffeine. General instructions  Keep a journal to find out if certain things bring on headaches. For example, write down: What you eat and drink. How much sleep you get. Any change to your diet or medicines. Get a massage or try other ways to relax. Limit stress. Sit up straight. Do not tighten (tense) your muscles. Do not smoke or use any products that contain nicotine or tobacco. If you need help quitting, ask your doctor. Exercise regularly as told by your doctor. Get enough sleep. This often means 7-9 hours of sleep each night. Keep all follow-up visits. This is important. Contact a doctor if: Medicine does not help your symptoms. You have a headache that feels different than the other headaches. You feel like you may vomit (nauseous) or you vomit. You have a fever. Get help right away if: Your headache: Gets very bad quickly. Gets worse after a lot of physical activity. You have any of these symptoms: You continue to vomit. A stiff neck. Trouble seeing. Your eye or ear hurts. Trouble speaking. Weak muscles or you lose muscle control. You lose your balance or have trouble walking. You feel like you will pass out (faint) or you pass out. You are mixed up (confused). You have a seizure. These symptoms may be an emergency. Get help right away. Call your local emergency services (911 in the [place] Do not wait to see if the symptoms will go away. Do not drive yourself to the hospital. Summary A headache is pain or discomfort that is felt around the head or neck  area. There are many causes and types of headaches. In some cases, the cause may not be found. Keep a journal to help find out what causes your headaches. Watch your condition for any changes. Let your doctor know about them. Contact a doctor if you have a headache that is different from usual, or if medicine does not help your headache. Get help right away if your headache gets very bad, you throw up, you have trouble seeing, you lose your balance, or you have a seizure. This information is not intended to replace advice given to you by your health care provider. Make sure you discuss any questions you have with your health care provider. Document Revised: 07/01/2020 Document Reviewed: 07/01/2020 Elsevier Patient Education  2024 [employer].   If you have been instructed to have an in-person evaluation today at a local  Urgent Care facility, please use the link below. It will take you to a list of all of our available [facility], including address, phone number and hours of operation. Please do not delay care.  [facility]  If you or a family member do not have a primary care provider, use the link below to schedule a visit and establish care. When you choose a [facility] primary care physician or advanced practice provider, you gain a long-term partner in health. Find a Primary Care Provider  Learn more about [facility] in-office and virtual care options: [facility] - Get Care Now

## 2024-01-27 NOTE — Progress Notes (Signed)
 Virtual Visit Consent   Marilyn Rhodes, you are scheduled for a virtual visit with a [facility] provider today. Just as with appointments in the office, your consent must be obtained to participate. Your consent will be active for this visit and any virtual visit you may have with one of our providers in the next 365 days. If you have a MyChart account, a copy of this consent can be sent to you electronically.  As this is a virtual visit, video technology does not allow for your provider to perform a traditional examination. This may limit your provider's ability to fully assess your condition. If your provider identifies any concerns that need to be evaluated in person or the need to arrange testing (such as labs, EKG, etc.), we will make arrangements to do so. Although advances in technology are sophisticated, we cannot ensure that it will always work on either your end or our end. If the connection with a video visit is poor, the visit may have to be switched to a telephone visit. With either a video or telephone visit, we are not always able to ensure that we have a secure connection.  By engaging in this virtual visit, you consent to the provision of healthcare and authorize for your insurance to be billed (if applicable) for the services provided during this visit. Depending on your insurance coverage, you may receive a charge related to this service.  I need to obtain your verbal consent now. Are you willing to proceed with your visit today? Marilyn Rhodes has provided verbal consent on 01/27/2024 for a virtual visit (video or telephone). Marilyn Rhodes, [place]  Date: 01/27/2024 11:22 AM   Virtual Visit via Video Note   I, Marilyn Rhodes, connected with  Marilyn Rhodes  ([id number], 05/18/1990) on 01/27/2024 at 11:15 AM EST by a video-enabled telemedicine application and verified that I am speaking with the correct person using two identifiers.  Location: Patient: Virtual Visit Location Patient:  Home Provider: Virtual Visit Location Provider: Home Office   I discussed the limitations of evaluation and management by telemedicine and the availability of in person appointments. The patient expressed understanding and agreed to proceed.    History of Present Illness: Marilyn Rhodes is a 33 y.o. who identifies as a female who was assigned female at birth, and is being seen today for c/o headache that occur at the back of her head that feel like lightening bolts that travel to the front of her head.  Pt states the headaches on going for 3 days.  Pt states headaches come and go.  Pt states she has taken ibuprofen and Excedrin. Pt states yesterday she was having the headaches that are sudden because it feels like lightening bolts.  Pt denies nausea, vomiting or vision changes.  Pt states she felt the lightening bolts this morning and has no headaches currently. Pt states she was seen virtually by her PCP yesterday and was told to go to the emergency room.   HPI: HPI  Problems:  Patient Active Problem List   Diagnosis Date Noted   Right arm numbness 11/28/2023   Anxiety and depression 11/05/2023   Class 1 obesity due to excess calories with body mass index (BMI) of 30.0 to 30.9 in adult 11/05/2023   Establishing care with new doctor, encounter for 10/12/2023   Encounter for lipid screening for cardiovascular disease 10/12/2023   Anxiety 10/12/2023   Chronic right shoulder pain 10/12/2023   Insomnia 10/12/2023   Acute non intractable tension-type  headache 10/02/2023   Irritable bowel syndrome with diarrhea 07/29/2019   Impingement syndrome of left shoulder region 09/01/2018   Paresthesia of hand 06/27/2018   Pain in right hand 06/26/2018   Mild intermittent asthma without complication 10/29/2017   Family history of factor V deficiency 10/29/2017   Family history of DVT 10/29/2017   Essential hypertension 10/27/2017   Implantable loop recorder present 09/12/2017   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  Resting comfortably at home.  Head is normocephalic, atraumatic.  No labored breathing.  Speech is clear and coherent with logical content.  Patient is alert and oriented at baseline.    Assessment and Plan: 1. Acute nonintractable headache, unspecified headache type (Primary)  -Pt was advised to proceed to emergency room for further evaluation of headaches -Pt verbalized understanding   Follow Up Instructions: I discussed the assessment and treatment plan with the patient. The patient was provided an opportunity to ask questions and Rhodes were answered. The patient agreed with the plan and demonstrated an understanding of the instructions.  A copy of instructions were sent to the patient via MyChart unless otherwise noted below.    The patient was advised to call back or seek an in-person evaluation if the symptoms worsen or if the condition fails to improve as anticipated.    Marilyn Mater, PA-C    [1]  Allergies Allergen Reactions   Penicillins Hives   Albuterol   [2]  Current Outpatient Medications:    atenolol  (TENORMIN ) 25 MG tablet, Take 1 tablet (25 mg total) by mouth daily., Disp: 90 tablet, Rfl: 1   busPIRone  (BUSPAR ) 10 MG tablet, Take 1 tablet (10 mg total) by mouth 2 (two) times daily., Disp: 180 tablet, Rfl: 1   celecoxib  (CELEBREX ) 200 MG capsule, Take 1 capsule (200 mg total) by mouth daily., Disp: 30 capsule, Rfl: 0   etodolac (LODINE XL) 400 MG 24 hr tablet, Take 400 mg by mouth daily as needed. , Disp: , Rfl:    methocarbamol  (ROBAXIN ) 500 MG tablet, Take 1 tablet (500 mg total) by mouth 2 (two) times daily., Disp: 20 tablet, Rfl: 0    midodrine (PROAMATINE) 2.5 MG tablet, Take 2.5 mg by mouth 3 (three) times daily with meals., Disp: , Rfl:    ondansetron  (ZOFRAN -ODT) 4 MG disintegrating tablet, Take 1 tablet (4 mg total) by mouth every 8 (eight) hours as needed for nausea or vomiting. (Patient not taking: Reported on 01/25/2024), Disp: 20 tablet, Rfl: 0   promethazine -dextromethorphan (PROMETHAZINE -DM) 6.25-15 MG/5ML syrup, Take 5 mLs by mouth 3 (three) times daily as needed for cough., Disp: 200 mL, Rfl: 0   Pseudoeph-Doxylamine-DM-APAP (NYQUIL PO), Take by mouth., Disp: , Rfl:    traZODone  (DESYREL ) 100 MG tablet, Take 1 tablet (100 mg total) by mouth at bedtime as needed for sleep., Disp: 30 tablet, Rfl: 2   Vitamin D , Ergocalciferol , (DRISDOL ) 1.25 MG (50000 UNIT) CAPS capsule, Take 1 capsule (50,000 Units total) by mouth every 7 (seven) days., Disp: 12 capsule, Rfl: 0

## 2024-01-28 ENCOUNTER — Ambulatory Visit

## 2024-01-29 NOTE — Therapy (Signed)
 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)    Benign positional vertigo    Bilateral shoulder pain    Bilateral thoracic back pain    Carpal tunnel syndrome    Cough    Dizziness    Dysmenorrhea    ECG abnormal    Fatigue    Flu    History of degenerative disc disease    Hypercholesteremia    Hypomagnesemia    Iron deficiency    Lower respiratory tract infection    Lumbar back pain    Muscle spasm of back    Myalgia    Nausea    Oligomenorrhea    Palpitations    Pneumonia, bacterial    Pre-diabetes    Right wrist pain    SOB (shortness of breath) on exertion    Vitamin D  deficiency    Past Surgical History:  Procedure Laterality Date   ABLATION     LOOP RECORDER INSERTION     Patient Active Problem List   Diagnosis Date Noted   Right arm numbness 11/28/2023   Anxiety and depression 11/05/2023   Class 1 obesity due to excess calories with body mass index (BMI) of 30.0 to 30.9 in adult 11/05/2023   Establishing care with new doctor, encounter for 10/12/2023   Encounter for lipid screening for cardiovascular disease 10/12/2023   Anxiety 10/12/2023   Chronic right shoulder pain 10/12/2023   Insomnia 10/12/2023   Acute non intractable tension-type headache 10/02/2023   Irritable bowel syndrome with diarrhea 07/29/2019   Impingement syndrome of left shoulder region 09/01/2018   Paresthesia of hand  06/27/2018   Pain in right hand 06/26/2018   Mild intermittent asthma without complication 10/29/2017   Family history of factor V deficiency 10/29/2017   Family history of DVT 10/29/2017   Essential hypertension 10/27/2017   Implantable loop recorder present 09/12/2017   Hypermobile joints 09/12/2017   Chronic bilateral thoracic back pain 01/20/2017   SVT (supraventricular tachycardia) 02/03/2015   POTS (postural orthostatic tachycardia syndrome) 07/03/2013    PCP: Georgina Speaks FNP  REFERRING PROVIDER: Burnetta Brunet, DO  REFERRING DIAG: [phone] (ICD-10-CM) - Chronic left shoulder pain M75.52 (ICD-10-CM) - Subacromial bursitis of left shoulder joint M89.9 (ICD-10-CM) - Scapular dysfunction Q79.60 (ICD-10-CM) - Ehlers-Danlos syndrome  THERAPY DIAG:  Chronic left shoulder pain  Stiffness of left shoulder, not elsewhere classified  Rationale for Evaluation and Treatment: Rehabilitation  ONSET DATE: chronic  SUBJECTIVE:  SUBJECTIVE STATEMENT: Pt reports a couple years of left shoulder pain.  Feels unstable occasionally with lifting arm.  Pain was severe and it is better after receiving a cortisone injection.   Pt states she has tingling and numbness occasionally in bilateral UE.  No shoulder surgeries.   Pt is RHD. PERTINENT HISTORY: Medical history: Ehlers-Danos, POTS, vertigo, CTS, thoracic pain, shoulder pain history, dizziness, DDD, hypercholesteremia, lumbar pain, myalgia, nausea, oligomenorrhea, SOB  PAIN:  NPRS scale: 5/10 Pain location: posterior, top Pain description: aching, sharp, loose Aggravating factors: lifting, cleaning Relieving factors: celebrex   PRECAUTIONS: None  WEIGHT BEARING RESTRICTIONS: No  FALLS:  Has patient fallen in last 6 months? No  LIVING  ENVIRONMENT: Lives with: lives with their family Lives in: House/apartment Stairs: Yes: External: 3 flights steps; on right going up Has following equipment at home: None  OCCUPATION: Communication specialist  PLOF: Independent  PATIENT GOALS:Decrease pain   Next MD visit:   OBJECTIVE:   DIAGNOSTIC FINDINGS:  11/74 view x-ray of the left shoulder including AP, Grashey, scapular Y and  axial view were ordered and reviewed by myself today.  X-rays demonstrate  humeral head well located within the glenohumeral joint.  There is no  significant arthritic change here or at the [facility] joint.  There is a  downsloping acromion noted.   PATIENT SURVEYS:  Patient-Specific Activity Scoring Scheme  0 represents unable to perform. 10 represents able to perform at prior level. 0 1 2 3 4 5 6 7 8 9  10 (Date and Score)   Activity Eval  01/30/2024    1. sleeping  5    2. Reaching up/out to clean  5    3.     4.    5.    Score 5    Total score = sum of the activity scores/number of activities Minimum detectable change (90%CI) for average score = 2 points Minimum detectable change (90%CI) for single activity score = 3 points  COGNITION: Overall cognitive status: WFL     SENSATION: WFL  POSTURE: 01/30/2024 Mild rounded shoulders, L shoulder down slope  UPPER EXTREMITY ROM:   ROM Right Eval  Left Eval 01/30/2024  Shoulder flexion  140  Shoulder extension  WNL+hyper  Shoulder abduction  125  Shoulder adduction    Shoulder internal rotation  WNL+hyper  Shoulder external rotation  49  Elbow flexion    Elbow extension    Wrist flexion    Wrist extension    Wrist ulnar deviation    Wrist radial deviation    Wrist pronation    Wrist supination    (Blank rows = not tested)  UPPER EXTREMITY MMT:  MMT Right Eval  Left Eval 01/30/2024  Shoulder flexion  3+  Shoulder extension    Shoulder abduction  3+  Shoulder adduction    Shoulder internal rotation  4+   Shoulder external rotation  4  Middle trapezius    Lower trapezius    Elbow flexion  4  Elbow extension  4  Wrist flexion    Wrist extension    Wrist ulnar deviation    Wrist radial deviation    Wrist pronation    Wrist supination    Grip strength (lbs)    (Blank rows = not tested)  SHOULDER SPECIAL TESTS: 01/25/2024 Rotator cuff assessment: Drop arm test: positive  and Empty can test: positive  Cervical Special tests neg  JOINT MOBILITY TESTING:  Test next visit - assumed hypermobile  PALPATION:  01/30/2024  No tenderness at tendons, GH jt                                                                                                                                                                                                  TODAY'S TREATMENT:                                                                                                       DATE: 01/30/24 Initial evaluation of L shoulder completed followed by instruction and trial set of HEP.  PATIENT EDUCATION: 01/30/2024 Education details: HEP, POC Person educated: Patient Education method: Programmer, Multimedia, Demonstration, Verbal cues, and Handouts Education comprehension: verbalized understanding, returned demonstration, and verbal cues required  HOME EXERCISE PROGRAM: Access Code: [facility] URL: [email or web address] Date: 01/30/2024 Prepared by: Burnard Meth  Exercises - Seated Scapular Retraction  - 2 x daily - 7 x weekly - 2 sets - 10 reps - Seated Shoulder Shrugs  - 2 x daily - 7 x weekly - 2 sets - 10 reps  ASSESSMENT:  CLINICAL IMPRESSION: Patient is a 33 y.o. who comes to clinic with complaints of L shoulder pain with mobility, strength and movement coordination deficits that impair their ability to perform usual daily and recreational functional activities without increase difficulty/symptoms at this time.  Patient to benefit from skilled PT services to address impairments and  limitations to improve to previous level of function without restriction secondary to condition.   OBJECTIVE IMPAIRMENTS: decreased ROM, decreased strength, impaired UE functional use, postural dysfunction, and pain.   ACTIVITY LIMITATIONS: reach over head  PARTICIPATION LIMITATIONS: cleaning  PERSONAL FACTORS: Time since onset of injury/illness/exacerbation and Medical history: vertigo, CTS, thoracic pain, shoulder pain history, dizziness, DDD, hypercholesteremia, lumbar pain, myalgia, nausea, oligomenorrhea, SOB are also affecting patient's functional outcome.   REHAB POTENTIAL: Good  CLINICAL DECISION MAKING: Stable/uncomplicated  EVALUATION COMPLEXITY: Moderate   GOALS: Goals reviewed with patient? Yes  SHORT TERM GOALS: 02/20/2024  3 weeks    1.Patient will demonstrate independent use of home exercise program to maintain progress from in clinic treatments. Goal status: New  LONG TERM GOALS:  04/09/2024  10 weeks     1.  Patient will demonstrate/report pain at worst less than or equal to 2/10 to facilitate minimal limitation in daily activity secondary to pain symptoms. Goal status: New   2. Patient will demonstrate independent use of home exercise program to facilitate ability to maintain/progress functional gains from skilled physical therapy services. Goal status: New   3. Patient will demonstrate Patient specific functional scale avg > or = 7/10 to indicate reduced disability due to condition.  Goal status: New   4.  Patient will demonstrate Lt UE MMT 4+/5 throughout to facilitate lifting, reaching, carrying at [facility] in daily activity.   Goal status: New   5.  Patient will demonstrate Lt GH joint AROM WFL with minimal to no pain to facilitate usual overhead reaching, self care, dressing at PLOF.    Goal status: New    PLAN:  PT FREQUENCY: 1-2x/week  PT DURATION: 10 weeks  PLANNED INTERVENTIONS: Can include 02853- PT Re-evaluation, 97110-Therapeutic exercises,  97530- Therapeutic activity, [id number]- Neuromuscular re-education, 97535- Self Care, 97140- Manual therapy, [phone]- Gait training, [phone]- Orthotic Fit/training, [phone]- Canalith repositioning, [id number]- Aquatic Therapy, [phone]- Electrical stimulation (unattended), [id number] Physical performance testing, 97016- Vasopneumatic device, [id number]- Ultrasound, [id number]- Traction (mechanical), [id number]- Ionotophoresis 4mg /ml Dexamethasone ,  79439 - Needle insertion w/o injection 1 or 2 muscles, 20561 - Needle insertion w/o injection 3 or more muscles.   Patient/Family education, Balance training, Stair training, Taping, Dry Needling, Joint mobilization, Joint manipulation, Spinal manipulation, Spinal mobilization, Scar mobilization, Vestibular training, Visual/preceptual remediation/compensation, DME instructions, Cryotherapy, and Moist heat.  All performed as medically necessary.  All included unless contraindicated  PLAN FOR NEXT SESSION: Review HEP knowledge/results.   Burnard Meth, PT 01/30/2024  2:53 PM

## 2024-01-30 ENCOUNTER — Ambulatory Visit

## 2024-01-30 ENCOUNTER — Other Ambulatory Visit: Payer: Self-pay

## 2024-01-30 DIAGNOSIS — G8929 Other chronic pain: Secondary | ICD-10-CM | POA: Diagnosis not present

## 2024-01-30 DIAGNOSIS — M25612 Stiffness of left shoulder, not elsewhere classified: Secondary | ICD-10-CM | POA: Diagnosis not present

## 2024-01-30 DIAGNOSIS — M25512 Pain in left shoulder: Secondary | ICD-10-CM | POA: Diagnosis not present

## 2024-01-31 NOTE — Therapy (Incomplete)
 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)    Benign positional vertigo    Bilateral shoulder pain    Bilateral thoracic back pain    Carpal tunnel syndrome    Cough    Dizziness    Dysmenorrhea    ECG abnormal    Fatigue    Flu    History of degenerative disc disease    Hypercholesteremia    Hypomagnesemia    Iron deficiency    Lower respiratory tract infection    Lumbar back pain    Muscle spasm of back    Myalgia    Nausea    Oligomenorrhea    Palpitations    Pneumonia, bacterial    Pre-diabetes    Right wrist pain    SOB (shortness of breath) on exertion    Vitamin D  deficiency    Past Surgical History:  Procedure Laterality Date   ABLATION     LOOP RECORDER INSERTION     Patient Active Problem List   Diagnosis Date Noted   Right arm numbness 11/28/2023   Anxiety and depression 11/05/2023   Class 1 obesity due to excess calories with body mass index (BMI) of 30.0 to 30.9 in adult 11/05/2023   Establishing care with new doctor, encounter for 10/12/2023   Encounter for lipid screening for cardiovascular disease 10/12/2023   Anxiety 10/12/2023   Chronic right shoulder pain 10/12/2023   Insomnia 10/12/2023   Acute non intractable tension-type headache 10/02/2023   Irritable bowel syndrome with diarrhea 07/29/2019   Impingement syndrome of left shoulder region 09/01/2018   Paresthesia of hand 06/27/2018   Pain in right hand 06/26/2018   Mild intermittent asthma without complication 10/29/2017   Family history of factor V deficiency 10/29/2017   Family history of DVT 10/29/2017   Essential hypertension 10/27/2017   Implantable loop recorder present 09/12/2017   Hypermobile joints 09/12/2017   Chronic bilateral  thoracic back pain 01/20/2017   SVT (supraventricular tachycardia) 02/03/2015   POTS (postural orthostatic tachycardia syndrome) 07/03/2013    PCP: Georgina Speaks FNP  REFERRING PROVIDER: Burnetta Brunet, DO  REFERRING DIAG: [phone] (ICD-10-CM) - Chronic left shoulder pain M75.52 (ICD-10-CM) - Subacromial bursitis of left shoulder joint M89.9 (ICD-10-CM) - Scapular dysfunction Q79.60 (ICD-10-CM) - Ehlers-Danlos syndrome  THERAPY DIAG:  No diagnosis found.  Rationale for Evaluation and Treatment: Rehabilitation  ONSET DATE: chronic  SUBJECTIVE:  SUBJECTIVE STATEMENT: ***Pt reports a couple years of left shoulder pain.  Feels unstable occasionally with lifting arm.  Pain was severe and it is better after receiving a cortisone injection.   Pt states she has tingling and numbness occasionally in bilateral UE.  No shoulder surgeries.   Pt is RHD. PERTINENT HISTORY: Medical history: Ehlers-Danos, POTS, vertigo, CTS, thoracic pain, shoulder pain history, dizziness, DDD, hypercholesteremia, lumbar pain, myalgia, nausea, oligomenorrhea, SOB  PAIN:  ***NPRS scale: 5/10 Pain location: posterior, top Pain description: aching, sharp, loose Aggravating factors: lifting, cleaning Relieving factors: celebrex   PRECAUTIONS: None  WEIGHT BEARING RESTRICTIONS: No  FALLS:  Has patient fallen in last 6 months? No  LIVING ENVIRONMENT: Lives with: lives with their family Lives in: House/apartment Stairs: Yes: External: 3 flights steps; on right going up Has following equipment at home: None  OCCUPATION: Communication specialist  PLOF: Independent  PATIENT GOALS:Decrease pain   Next MD visit:   OBJECTIVE:   DIAGNOSTIC FINDINGS:  11/74 view x-ray of the left shoulder including AP, Grashey, scapular Y  and  axial view were ordered and reviewed by myself today.  X-rays demonstrate  humeral head well located within the glenohumeral joint.  There is no  significant arthritic change here or at the [facility] joint.  There is a  downsloping acromion noted.   PATIENT SURVEYS:  Patient-Specific Activity Scoring Scheme  0 represents unable to perform. 10 represents able to perform at prior level. 0 1 2 3 4 5 6 7 8 9  10 (Date and Score)   Activity Eval  01/30/2024    1. sleeping  5    2. Reaching up/out to clean  5    3.     4.    5.    Score 5    Total score = sum of the activity scores/number of activities Minimum detectable change (90%CI) for average score = 2 points Minimum detectable change (90%CI) for single activity score = 3 points  COGNITION: Overall cognitive status: WFL     SENSATION: WFL  POSTURE: 01/30/2024 Mild rounded shoulders, L shoulder down slope  UPPER EXTREMITY ROM:   ROM Right Eval  Left Eval 01/30/2024  Shoulder flexion  140  Shoulder extension  WNL+hyper  Shoulder abduction  125  Shoulder adduction    Shoulder internal rotation  WNL+hyper  Shoulder external rotation  49  Elbow flexion    Elbow extension    Wrist flexion    Wrist extension    Wrist ulnar deviation    Wrist radial deviation    Wrist pronation    Wrist supination    (Blank rows = not tested)  UPPER EXTREMITY MMT:  MMT Right Eval  Left Eval 01/30/2024  Shoulder flexion  3+  Shoulder extension    Shoulder abduction  3+  Shoulder adduction    Shoulder internal rotation  4+  Shoulder external rotation  4  Middle trapezius    Lower trapezius    Elbow flexion  4  Elbow extension  4  Wrist flexion    Wrist extension    Wrist ulnar deviation    Wrist radial deviation    Wrist pronation    Wrist supination    Grip strength (lbs)    (Blank rows = not tested)  SHOULDER SPECIAL TESTS: 01/25/2024 Rotator cuff assessment: Drop arm test: positive  and Empty can  test: positive  Cervical Special tests neg  JOINT MOBILITY TESTING:  Test next visit - assumed hypermobile  PALPATION:  01/30/2024  No tenderness at tendons, GH jt                                                                                                                                                                                                  TODAY'S TREATMENT:                                                                                                        02/02/24***   DATE: 01/30/24 Initial evaluation of L shoulder completed followed by instruction and trial set of HEP.  PATIENT EDUCATION: 01/30/2024 Education details: HEP, POC Person educated: Patient Education method: Programmer, Multimedia, Demonstration, Verbal cues, and Handouts Education comprehension: verbalized understanding, returned demonstration, and verbal cues required  HOME EXERCISE PROGRAM: Access Code: [facility] URL: [email or web address] Date: 01/30/2024 Prepared by: Burnard Meth  Exercises - Seated Scapular Retraction  - 2 x daily - 7 x weekly - 2 sets - 10 reps - Seated Shoulder Shrugs  - 2 x daily - 7 x weekly - 2 sets - 10 reps  ASSESSMENT:  CLINICAL IMPRESSION: ***Patient is a 33 y.o. who comes to clinic with complaints of L shoulder pain with mobility, strength and movement coordination deficits that impair their ability to perform usual daily and recreational functional activities without increase difficulty/symptoms at this time.  Patient to benefit from skilled PT services to address impairments and limitations to improve to previous level of function without restriction secondary to condition.   OBJECTIVE IMPAIRMENTS: decreased ROM, decreased strength, impaired UE functional use, postural dysfunction, and pain.   ACTIVITY LIMITATIONS: reach over head  PARTICIPATION LIMITATIONS: cleaning  PERSONAL FACTORS: Time since onset of injury/illness/exacerbation and Medical history:  vertigo, CTS, thoracic pain, shoulder pain history, dizziness, DDD, hypercholesteremia, lumbar pain, myalgia, nausea, oligomenorrhea, SOB are also affecting patient's functional outcome.   REHAB POTENTIAL: Good  CLINICAL DECISION MAKING: Stable/uncomplicated  EVALUATION COMPLEXITY: Moderate   GOALS: Goals reviewed with patient? Yes  SHORT TERM GOALS: 02/20/2024  3 weeks    1.Patient will demonstrate independent use of home exercise program to maintain progress from in clinic treatments. Goal status: New  LONG TERM GOALS:  04/09/2024  10 weeks  1. Patient will demonstrate/report pain at worst less than or equal to 2/10 to facilitate minimal limitation in daily activity secondary to pain symptoms. Goal status: New   2. Patient will demonstrate independent use of home exercise program to facilitate ability to maintain/progress functional gains from skilled physical therapy services. Goal status: New   3. Patient will demonstrate Patient specific functional scale avg > or = 7/10 to indicate reduced disability due to condition.  Goal status: New   4.  Patient will demonstrate Lt UE MMT 4+/5 throughout to facilitate lifting, reaching, carrying at [facility] in daily activity.   Goal status: New   5.  Patient will demonstrate Lt GH joint AROM WFL with minimal to no pain to facilitate usual overhead reaching, self care, dressing at PLOF.    Goal status: New    PLAN:  PT FREQUENCY: 1-2x/week  PT DURATION: 10 weeks  PLANNED INTERVENTIONS: Can include 02853- PT Re-evaluation, 97110-Therapeutic exercises, 97530- Therapeutic activity, [id number]- Neuromuscular re-education, 97535- Self Care, 97140- Manual therapy, [phone]- Gait training, [phone]- Orthotic Fit/training, [phone]- Canalith repositioning, [id number]- Aquatic Therapy, [phone]- Electrical stimulation (unattended), [id number] Physical performance testing, 97016- Vasopneumatic device, [id number]- Ultrasound, [id number]- Traction (mechanical), [id number]- Ionotophoresis  4mg /ml Dexamethasone ,  79439 - Needle insertion w/o injection 1 or 2 muscles, 20561 - Needle insertion w/o injection 3 or more muscles.   Patient/Family education, Balance training, Stair training, Taping, Dry Needling, Joint mobilization, Joint manipulation, Spinal manipulation, Spinal mobilization, Scar mobilization, Vestibular training, Visual/preceptual remediation/compensation, DME instructions, Cryotherapy, and Moist heat.  All performed as medically necessary.  All included unless contraindicated  PLAN FOR NEXT SESSION:*** Review HEP knowledge/results.   Burnard Meth, PT 01/31/2024  4:41 PM

## 2024-02-01 ENCOUNTER — Other Ambulatory Visit (HOSPITAL_COMMUNITY): Payer: Self-pay

## 2024-02-02 ENCOUNTER — Telehealth: Payer: Self-pay

## 2024-02-02 ENCOUNTER — Encounter

## 2024-02-02 ENCOUNTER — Ambulatory Visit: Admitting: Sports Medicine

## 2024-02-02 NOTE — Telephone Encounter (Signed)
 Called cell# about 02/02/24 missed PT appointment.  No answer and no voicemail set up.

## 2024-02-04 NOTE — Assessment & Plan Note (Signed)
 Does not feel the buspirone  is working effectively. Will increase the dose to 10 mg two times daily. Continue f/u with Christina.

## 2024-02-04 NOTE — Assessment & Plan Note (Signed)
 Will check vitamin D  level and supplement as needed.    Also encouraged to spend 15 minutes in the sun daily.

## 2024-02-04 NOTE — Assessment & Plan Note (Signed)
 Blood pressure is well controlled. Continue current medications.

## 2024-02-04 NOTE — Assessment & Plan Note (Signed)
 No issues

## 2024-02-05 ENCOUNTER — Ambulatory Visit: Payer: Self-pay | Admitting: Nurse Practitioner

## 2024-02-12 NOTE — Therapy (Signed)
 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)    Benign positional vertigo    Bilateral shoulder pain    Bilateral thoracic back pain    Carpal tunnel syndrome    Cough    Dizziness    Dysmenorrhea    ECG abnormal    Fatigue    Flu    History of degenerative disc disease    Hypercholesteremia    Hypomagnesemia    Iron deficiency    Lower respiratory tract infection    Lumbar back pain    Muscle spasm of back    Myalgia    Nausea    Oligomenorrhea    Palpitations    Pneumonia, bacterial    Pre-diabetes    Right wrist pain    SOB (shortness of breath) on exertion    Vitamin D  deficiency    Past Surgical History:  Procedure Laterality Date   ABLATION     LOOP RECORDER INSERTION     Patient Active Problem List   Diagnosis Date Noted   Vitamin D  deficiency 02/04/2024   Right arm numbness 11/28/2023   Anxiety and depression 11/05/2023   Class 1 obesity due to excess calories with body mass index (BMI) of 30.0 to 30.9 in adult 11/05/2023   Establishing care with new doctor, encounter for 10/12/2023   Encounter for lipid screening for cardiovascular disease 10/12/2023   Anxiety 10/12/2023   Chronic right shoulder pain 10/12/2023   Insomnia 10/12/2023   Acute non intractable tension-type headache 10/02/2023   Irritable bowel syndrome with diarrhea 07/29/2019   Impingement syndrome of left shoulder region  09/01/2018   Paresthesia of hand 06/27/2018   Pain in right hand 06/26/2018   Mild intermittent asthma without complication 10/29/2017   Family history of factor V deficiency 10/29/2017   Family history of DVT 10/29/2017   Essential hypertension 10/27/2017   Implantable loop recorder present 09/12/2017   Hypermobile joints 09/12/2017   Chronic bilateral thoracic back pain 01/20/2017   SVT (supraventricular tachycardia) 02/03/2015   POTS (postural orthostatic tachycardia syndrome) 07/03/2013    PCP: Georgina Speaks FNP  REFERRING PROVIDER: Burnetta Brunet, DO  REFERRING DIAG: [phone] (ICD-10-CM) - Chronic left shoulder pain M75.52 (ICD-10-CM) - Subacromial bursitis of left shoulder joint M89.9 (ICD-10-CM) - Scapular dysfunction Q79.60 (ICD-10-CM) - Ehlers-Danlos syndrome  THERAPY DIAG:  Chronic left shoulder pain  Stiffness of left shoulder, not elsewhere classified  Rationale for Evaluation and Treatment: Rehabilitation  ONSET DATE: chronic  SUBJECTIVE:  SUBJECTIVE STATEMENT: Pt reports L shoulder was hurting  a lot yesterday.    PERTINENT HISTORY: Medical history: Ehlers-Danos, POTS, vertigo, CTS, thoracic pain, shoulder pain history, dizziness, DDD, hypercholesteremia, lumbar pain, myalgia, nausea, oligomenorrhea, SOB  PAIN:  NPRS scale: 6/10 Pain location: posterior, top Pain description: aching, sharp, loose Aggravating factors: lifting, cleaning Relieving factors: celebrex   PRECAUTIONS: None  WEIGHT BEARING RESTRICTIONS: No  FALLS:  Has patient fallen in last 6 months? No  LIVING ENVIRONMENT: Lives with: lives with their family Lives in: House/apartment Stairs: Yes: External: 3 flights steps; on right going up Has following equipment at home:  None  OCCUPATION: Communication specialist  PLOF: Independent  PATIENT GOALS:Decrease pain   Next MD visit:   OBJECTIVE:   DIAGNOSTIC FINDINGS:  11/74 view x-ray of the left shoulder including AP, Grashey, scapular Y and  axial view were ordered and reviewed by myself today.  X-rays demonstrate  humeral head well located within the glenohumeral joint.  There is no  significant arthritic change here or at the [facility] joint.  There is a  downsloping acromion noted.   PATIENT SURVEYS:  Patient-Specific Activity Scoring Scheme  0 represents unable to perform. 10 represents able to perform at prior level. 0 1 2 3 4 5 6 7 8 9  10 (Date and Score)   Activity Eval  01/30/2024    1. sleeping  5    2. Reaching up/out to clean  5    3.     4.    5.    Score 5    Total score = sum of the activity scores/number of activities Minimum detectable change (90%CI) for average score = 2 points Minimum detectable change (90%CI) for single activity score = 3 points  COGNITION: Overall cognitive status: WFL     SENSATION: WFL  POSTURE: 01/30/2024 Mild rounded shoulders, L shoulder down slope  UPPER EXTREMITY ROM:   ROM Right Eval  Left Eval 01/30/2024  Shoulder flexion  140  Shoulder extension  WNL+hyper  Shoulder abduction  125  Shoulder adduction    Shoulder internal rotation  WNL+hyper  Shoulder external rotation  49  Elbow flexion    Elbow extension    Wrist flexion    Wrist extension    Wrist ulnar deviation    Wrist radial deviation    Wrist pronation    Wrist supination    (Blank rows = not tested)  UPPER EXTREMITY MMT:  MMT Right Eval  Left Eval 01/30/2024  Shoulder flexion  3+  Shoulder extension    Shoulder abduction  3+  Shoulder adduction    Shoulder internal rotation  4+  Shoulder external rotation  4  Middle trapezius    Lower trapezius    Elbow flexion  4  Elbow extension  4  Wrist flexion    Wrist extension    Wrist ulnar deviation     Wrist radial deviation    Wrist pronation    Wrist supination    Grip strength (lbs)    (Blank rows = not tested)  SHOULDER SPECIAL TESTS: 01/25/2024 Rotator cuff assessment: Drop arm test: positive  and Empty can test: positive  Cervical Special tests neg  JOINT MOBILITY TESTING:  Test next visit - assumed hypermobile  PALPATION:  01/30/2024 No tenderness at tendons, GH jt  TODAY'S TREATMENT:  Shoulder     Left                                                                                                 02/13/24 Pulleys 3 min TB rows 3x10 green TB ext 3x10 green TB IR 3x10 green Ball on wall 20x CCW and 20x CW Shrugs 20x  Supine shoulder chest press 3# bar 3x10 Supine shoulder flexion 2# bar 2x10 Manual- Percussive device to UT, MT and rhomboids  DATE: 01/30/24 Initial evaluation of L shoulder completed followed by instruction and trial set of HEP.  PATIENT EDUCATION: 01/30/2024 Education details: HEP, POC Person educated: Patient Education method: Programmer, Multimedia, Demonstration, Verbal cues, and Handouts Education comprehension: verbalized understanding, returned demonstration, and verbal cues required  HOME EXERCISE PROGRAM: Access Code: [facility] URL: [email or web address] Date: 01/30/2024 Prepared by: Burnard Meth  Exercises - Seated Scapular Retraction  - 2 x daily - 7 x weekly - 2 sets - 10 reps - Seated Shoulder Shrugs  - 2 x daily - 7 x weekly - 2 sets - 10 reps  ASSESSMENT:  CLINICAL IMPRESSION: Patient needed VC and tactile cues for new exercises .  Demonstrated understanding. OBJECTIVE IMPAIRMENTS: decreased ROM, decreased strength, impaired UE functional use, postural dysfunction, and pain.   ACTIVITY LIMITATIONS: reach over  head  PARTICIPATION LIMITATIONS: cleaning  PERSONAL FACTORS: Time since onset of injury/illness/exacerbation and Medical history: vertigo, CTS, thoracic pain, shoulder pain history, dizziness, DDD, hypercholesteremia, lumbar pain, myalgia, nausea, oligomenorrhea, SOB are also affecting patient's functional outcome.   REHAB POTENTIAL: Good  CLINICAL DECISION MAKING: Stable/uncomplicated  EVALUATION COMPLEXITY: Moderate   GOALS: Goals reviewed with patient? Yes  SHORT TERM GOALS: 02/20/2024  3 weeks    1.Patient will demonstrate independent use of home exercise program to maintain progress from in clinic treatments. Goal status: New  LONG TERM GOALS:  04/09/2024  10 weeks     1. Patient will demonstrate/report pain at worst less than or equal to 2/10 to facilitate minimal limitation in daily activity secondary to pain symptoms. Goal status: New   2. Patient will demonstrate independent use of home exercise program to facilitate ability to maintain/progress functional gains from skilled physical therapy services. Goal status: New   3. Patient will demonstrate Patient specific functional scale avg > or = 7/10 to indicate reduced disability due to condition.  Goal status: New   4.  Patient will demonstrate Lt UE MMT 4+/5 throughout to facilitate lifting, reaching, carrying at [facility] in daily activity.   Goal status: New   5.  Patient will demonstrate Lt GH joint AROM WFL with minimal to no pain to facilitate usual overhead reaching, self care, dressing at PLOF.    Goal status: New    PLAN:  PT FREQUENCY: 1-2x/week  PT DURATION: 10 weeks  PLANNED INTERVENTIONS: Can include 02853- PT Re-evaluation, 97110-Therapeutic exercises, 97530- Therapeutic activity, [id number]- Neuromuscular re-education, 97535- Self Care, 97140- Manual therapy, [id number]- Gait training, [phone]- Orthotic Fit/training, [phone]- Canalith repositioning, [id number]- Aquatic Therapy, H9716- Electrical stimulation (unattended),  [id number] Physical performance testing, 97016- Vasopneumatic device, [id number]- Ultrasound, 02987- Traction (mechanical),  02966- Ionotophoresis 4mg /ml Dexamethasone ,  20560 - Needle insertion w/o injection 1 or 2 muscles, 20561 - Needle insertion w/o injection 3 or more muscles.   Patient/Family education, Balance training, Stair training, Taping, Dry Needling, Joint mobilization, Joint manipulation, Spinal manipulation, Spinal mobilization, Scar mobilization, Vestibular training, Visual/preceptual remediation/compensation, DME instructions, Cryotherapy, and Moist heat.  All performed as medically necessary.  All included unless contraindicated  PLAN FOR NEXT SESSION: Review HEP knowledge/results.   Burnard Meth, PT 02/13/2024  2:51 PM    "

## 2024-02-13 ENCOUNTER — Ambulatory Visit

## 2024-02-13 DIAGNOSIS — G8929 Other chronic pain: Secondary | ICD-10-CM

## 2024-02-13 DIAGNOSIS — M25612 Stiffness of left shoulder, not elsewhere classified: Secondary | ICD-10-CM | POA: Diagnosis not present

## 2024-02-13 DIAGNOSIS — M25512 Pain in left shoulder: Secondary | ICD-10-CM | POA: Diagnosis not present

## 2024-02-14 ENCOUNTER — Other Ambulatory Visit (HOSPITAL_COMMUNITY): Payer: Self-pay

## 2024-02-14 ENCOUNTER — Telehealth: Admitting: Physician Assistant

## 2024-02-14 DIAGNOSIS — B9689 Other specified bacterial agents as the cause of diseases classified elsewhere: Secondary | ICD-10-CM

## 2024-02-14 DIAGNOSIS — N760 Acute vaginitis: Secondary | ICD-10-CM | POA: Diagnosis not present

## 2024-02-14 MED ORDER — METRONIDAZOLE 500 MG PO TABS
500.0000 mg | ORAL_TABLET | Freq: Two times a day (BID) | ORAL | 0 refills | Status: AC
  Filled 2024-02-14 – 2024-03-18 (×3): qty 14, 7d supply, fill #0

## 2024-02-14 NOTE — Therapy (Incomplete)
 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)    Benign positional vertigo    Bilateral shoulder pain    Bilateral thoracic back pain    Carpal tunnel syndrome    Cough    Dizziness    Dysmenorrhea    ECG abnormal    Fatigue    Flu    History of degenerative disc disease    Hypercholesteremia    Hypomagnesemia    Iron deficiency    Lower respiratory tract infection    Lumbar back pain    Muscle spasm of back    Myalgia    Nausea    Oligomenorrhea    Palpitations    Pneumonia, bacterial    Pre-diabetes    Right wrist pain    SOB (shortness of breath) on exertion    Vitamin D  deficiency    Past Surgical History:  Procedure Laterality Date   ABLATION     LOOP RECORDER INSERTION     Patient Active Problem List   Diagnosis Date Noted   Vitamin D  deficiency 02/04/2024   Right arm numbness 11/28/2023   Anxiety and depression 11/05/2023   Class 1 obesity due to excess calories with body mass index (BMI) of 30.0 to 30.9 in adult 11/05/2023   Establishing care with new doctor, encounter for 10/12/2023   Encounter for lipid screening for cardiovascular disease 10/12/2023   Anxiety 10/12/2023   Chronic right shoulder pain 10/12/2023   Insomnia 10/12/2023   Acute non intractable tension-type headache 10/02/2023   Irritable bowel syndrome with diarrhea 07/29/2019   Impingement syndrome of left shoulder region 09/01/2018   Paresthesia of hand 06/27/2018   Pain in right hand 06/26/2018   Mild intermittent asthma without complication 10/29/2017   Family history of factor V deficiency 10/29/2017   Family history of DVT 10/29/2017   Essential hypertension 10/27/2017   Implantable loop recorder present 09/12/2017   Hypermobile  joints 09/12/2017   Chronic bilateral thoracic back pain 01/20/2017   SVT (supraventricular tachycardia) 02/03/2015   POTS (postural orthostatic tachycardia syndrome) 07/03/2013    PCP: Georgina Speaks FNP  REFERRING PROVIDER: Burnetta Brunet, DO  REFERRING DIAG: [phone] (ICD-10-CM) - Chronic left shoulder pain M75.52 (ICD-10-CM) - Subacromial bursitis of left shoulder joint M89.9 (ICD-10-CM) - Scapular dysfunction Q79.60 (ICD-10-CM) - Ehlers-Danlos syndrome  THERAPY DIAG:  No diagnosis found.  Rationale for Evaluation and Treatment: Rehabilitation  ONSET DATE: chronic  SUBJECTIVE:  SUBJECTIVE STATEMENT: ***Pt reports L shoulder was hurting  a lot yesterday.    PERTINENT HISTORY: Medical history: Ehlers-Danos, POTS, vertigo, CTS, thoracic pain, shoulder pain history, dizziness, DDD, hypercholesteremia, lumbar pain, myalgia, nausea, oligomenorrhea, SOB  PAIN:  ***NPRS scale: 6/10 Pain location: posterior, top Pain description: aching, sharp, loose Aggravating factors: lifting, cleaning Relieving factors: celebrex   PRECAUTIONS: None  WEIGHT BEARING RESTRICTIONS: No  FALLS:  Has patient fallen in last 6 months? No  LIVING ENVIRONMENT: Lives with: lives with their family Lives in: House/apartment Stairs: Yes: External: 3 flights steps; on right going up Has following equipment at home: None  OCCUPATION: Communication specialist  PLOF: Independent  PATIENT GOALS:Decrease pain   Next MD visit:   OBJECTIVE:   DIAGNOSTIC FINDINGS:  11/74 view x-ray of the left shoulder including AP, Grashey, scapular Y and  axial view were ordered and reviewed by myself today.  X-rays demonstrate  humeral head well located within the glenohumeral joint.  There is no  significant arthritic change  here or at the [facility] joint.  There is a  downsloping acromion noted.   PATIENT SURVEYS:  Patient-Specific Activity Scoring Scheme  0 represents unable to perform. 10 represents able to perform at prior level. 0 1 2 3 4 5 6 7 8 9  10 (Date and Score)   Activity Eval  01/30/2024    1. sleeping  5    2. Reaching up/out to clean  5    3.     4.    5.    Score 5    Total score = sum of the activity scores/number of activities Minimum detectable change (90%CI) for average score = 2 points Minimum detectable change (90%CI) for single activity score = 3 points  COGNITION: Overall cognitive status: WFL     SENSATION: WFL  POSTURE: 01/30/2024 Mild rounded shoulders, L shoulder down slope  UPPER EXTREMITY ROM:   ROM Right Eval  Left Eval 01/30/2024  Shoulder flexion  140  Shoulder extension  WNL+hyper  Shoulder abduction  125  Shoulder adduction    Shoulder internal rotation  WNL+hyper  Shoulder external rotation  49  Elbow flexion    Elbow extension    Wrist flexion    Wrist extension    Wrist ulnar deviation    Wrist radial deviation    Wrist pronation    Wrist supination    (Blank rows = not tested)  UPPER EXTREMITY MMT:  MMT Right Eval  Left Eval 01/30/2024  Shoulder flexion  3+  Shoulder extension    Shoulder abduction  3+  Shoulder adduction    Shoulder internal rotation  4+  Shoulder external rotation  4  Middle trapezius    Lower trapezius    Elbow flexion  4  Elbow extension  4  Wrist flexion    Wrist extension    Wrist ulnar deviation    Wrist radial deviation    Wrist pronation    Wrist supination    Grip strength (lbs)    (Blank rows = not tested)  SHOULDER SPECIAL TESTS: 01/25/2024 Rotator cuff assessment: Drop arm test: positive  and Empty can test: positive  Cervical Special tests neg  JOINT MOBILITY TESTING:  Test next visit - assumed hypermobile  PALPATION:  01/30/2024 No tenderness at tendons, GH jt  TODAY'S TREATMENT:  Shoulder     Left                                                                                                 02/20/24***     02/13/24 Pulleys 3 min TB rows 3x10 green TB ext 3x10 green TB IR 3x10 green Ball on wall 20x CCW and 20x CW Shrugs 20x  Supine shoulder chest press 3# bar 3x10 Supine shoulder flexion 2# bar 2x10 Manual- Percussive device to UT, MT and rhomboids  DATE: 01/30/24 Initial evaluation of L shoulder completed followed by instruction and trial set of HEP.  PATIENT EDUCATION: 01/30/2024 Education details: HEP, POC Person educated: Patient Education method: Programmer, Multimedia, Demonstration, Verbal cues, and Handouts Education comprehension: verbalized understanding, returned demonstration, and verbal cues required  HOME EXERCISE PROGRAM: Access Code: [facility] URL: [email or web address] Date: 01/30/2024 Prepared by: Burnard Meth  Exercises - Seated Scapular Retraction  - 2 x daily - 7 x weekly - 2 sets - 10 reps - Seated Shoulder Shrugs  - 2 x daily - 7 x weekly - 2 sets - 10 reps  ASSESSMENT:  CLINICAL IMPRESSION: ***Patient needed VC and tactile cues for new exercises .  Demonstrated understanding. OBJECTIVE IMPAIRMENTS: decreased ROM, decreased strength, impaired UE functional use, postural dysfunction, and pain.   ACTIVITY LIMITATIONS: reach over head  PARTICIPATION LIMITATIONS: cleaning  PERSONAL FACTORS: Time since onset of injury/illness/exacerbation and Medical history: vertigo, CTS, thoracic pain, shoulder pain history, dizziness, DDD, hypercholesteremia, lumbar pain, myalgia, nausea, oligomenorrhea, SOB are also affecting patient's functional outcome.   REHAB POTENTIAL: Good  CLINICAL DECISION MAKING:  Stable/uncomplicated  EVALUATION COMPLEXITY: Moderate   GOALS: Goals reviewed with patient? Yes  SHORT TERM GOALS: 02/20/2024  3 weeks***    1.Patient will demonstrate independent use of home exercise program to maintain progress from in clinic treatments. Goal status: New  LONG TERM GOALS:  04/09/2024  10 weeks     1. Patient will demonstrate/report pain at worst less than or equal to 2/10 to facilitate minimal limitation in daily activity secondary to pain symptoms. Goal status: New   2. Patient will demonstrate independent use of home exercise program to facilitate ability to maintain/progress functional gains from skilled physical therapy services. Goal status: New   3. Patient will demonstrate Patient specific functional scale avg > or = 7/10 to indicate reduced disability due to condition.  Goal status: New   4.  Patient will demonstrate Lt UE MMT 4+/5 throughout to facilitate lifting, reaching, carrying at [facility] in daily activity.   Goal status: New   5.  Patient will demonstrate Lt GH joint AROM WFL with minimal to no pain to facilitate usual overhead reaching, self care, dressing at PLOF.    Goal status: New    PLAN:  PT FREQUENCY: 1-2x/week  PT DURATION: 10 weeks  PLANNED INTERVENTIONS: Can include 02853- PT Re-evaluation, 97110-Therapeutic exercises, 97530- Therapeutic activity, [id number]- Neuromuscular re-education, 97535- Self Care, 97140- Manual therapy, [id number]- Gait training, [id number]- Orthotic Fit/training, [phone]- Canalith repositioning, [id number]- Aquatic Therapy, H9716- Electrical stimulation (unattended), [id number] Physical performance testing, 97016- Vasopneumatic device, [id number]-  Ultrasound, 02987- Traction (mechanical), [id number]- Ionotophoresis 4mg /ml Dexamethasone ,  79439 - Needle insertion w/o injection 1 or 2 muscles, 20561 - Needle insertion w/o injection 3 or more muscles.   Patient/Family education, Balance training, Stair training, Taping, Dry Needling, Joint  mobilization, Joint manipulation, Spinal manipulation, Spinal mobilization, Scar mobilization, Vestibular training, Visual/preceptual remediation/compensation, DME instructions, Cryotherapy, and Moist heat.  All performed as medically necessary.  All included unless contraindicated  PLAN FOR NEXT SESSION: ***  Burnard Meth, PT 02/14/2024  2:40 PM    "

## 2024-02-14 NOTE — Progress Notes (Signed)

## 2024-02-19 ENCOUNTER — Encounter

## 2024-02-21 NOTE — Therapy (Incomplete)
 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)    Benign positional vertigo    Bilateral shoulder pain    Bilateral thoracic back pain    Carpal tunnel syndrome    Cough    Dizziness    Dysmenorrhea    ECG abnormal    Fatigue    Flu    History of degenerative disc disease    Hypercholesteremia    Hypomagnesemia    Iron deficiency    Lower respiratory tract infection    Lumbar back pain    Muscle spasm of back    Myalgia    Nausea    Oligomenorrhea    Palpitations    Pneumonia, bacterial    Pre-diabetes    Right wrist pain    SOB (shortness of breath) on exertion    Vitamin D  deficiency    Past Surgical History:  Procedure Laterality Date   ABLATION     LOOP RECORDER INSERTION     Patient Active Problem List   Diagnosis Date Noted   Vitamin D  deficiency 02/04/2024   Right arm numbness 11/28/2023   Anxiety and depression 11/05/2023   Class 1 obesity due to excess calories with body mass index (BMI) of 30.0 to 30.9 in adult 11/05/2023   Establishing care with new doctor, encounter for 10/12/2023   Encounter for lipid screening for cardiovascular disease 10/12/2023   Anxiety 10/12/2023   Chronic right shoulder pain 10/12/2023   Insomnia 10/12/2023   Acute non intractable tension-type headache 10/02/2023   Irritable bowel syndrome with diarrhea 07/29/2019   Impingement syndrome of left shoulder region 09/01/2018   Paresthesia of hand 06/27/2018   Pain in right hand 06/26/2018   Mild intermittent asthma without complication 10/29/2017   Family history of factor V deficiency 10/29/2017   Family history of DVT 10/29/2017   Essential hypertension 10/27/2017   Implantable loop recorder present 09/12/2017   Hypermobile joints  09/12/2017   Chronic bilateral thoracic back pain 01/20/2017   SVT (supraventricular tachycardia) 02/03/2015   POTS (postural orthostatic tachycardia syndrome) 07/03/2013    PCP: Georgina Speaks FNP  REFERRING PROVIDER: Burnetta Brunet, DO  REFERRING DIAG: [id number] (ICD-10-CM) - Chronic left shoulder pain M75.52 (ICD-10-CM) - Subacromial bursitis of left shoulder joint M89.9 (ICD-10-CM) - Scapular dysfunction Q79.60 (ICD-10-CM) - Ehlers-Danlos syndrome  THERAPY DIAG:  No diagnosis found.  Rationale for Evaluation and Treatment: Rehabilitation  ONSET DATE: chronic  SUBJECTIVE:  SUBJECTIVE STATEMENT: ***Pt reports L shoulder was hurting  a lot yesterday.    PERTINENT HISTORY: Medical history: Ehlers-Danos, POTS, vertigo, CTS, thoracic pain, shoulder pain history, dizziness, DDD, hypercholesteremia, lumbar pain, myalgia, nausea, oligomenorrhea, SOB  PAIN:  ***NPRS scale: 6/10 Pain location: posterior, top Pain description: aching, sharp, loose Aggravating factors: lifting, cleaning Relieving factors: celebrex   PRECAUTIONS: None  WEIGHT BEARING RESTRICTIONS: No  FALLS:  Has patient fallen in last 6 months? No  LIVING ENVIRONMENT: Lives with: lives with their family Lives in: House/apartment Stairs: Yes: External: 3 flights steps; on right going up Has following equipment at home: None  OCCUPATION: Communication specialist  PLOF: Independent  PATIENT GOALS:Decrease pain   Next MD visit:   OBJECTIVE:   DIAGNOSTIC FINDINGS:  11/74 view x-ray of the left shoulder including AP, Grashey, scapular Y and  axial view were ordered and reviewed by myself today.  X-rays demonstrate  humeral head well located within the glenohumeral joint.  There is no  significant arthritic change here or  at the [facility] joint.  There is a  downsloping acromion noted.   PATIENT SURVEYS:  Patient-Specific Activity Scoring Scheme  0 represents unable to perform. 10 represents able to perform at prior level. 0 1 2 3 4 5 6 7 8 9  10 (Date and Score)   Activity Eval  01/30/2024    1. sleeping  5    2. Reaching up/out to clean  5    3.     4.    5.    Score 5    Total score = sum of the activity scores/number of activities Minimum detectable change (90%CI) for average score = 2 points Minimum detectable change (90%CI) for single activity score = 3 points  COGNITION: Overall cognitive status: WFL     SENSATION: WFL  POSTURE: 01/30/2024 Mild rounded shoulders, L shoulder down slope  UPPER EXTREMITY ROM:   ROM Right Eval  Left Eval 01/30/2024  Shoulder flexion  140  Shoulder extension  WNL+hyper  Shoulder abduction  125  Shoulder adduction    Shoulder internal rotation  WNL+hyper  Shoulder external rotation  49  Elbow flexion    Elbow extension    Wrist flexion    Wrist extension    Wrist ulnar deviation    Wrist radial deviation    Wrist pronation    Wrist supination    (Blank rows = not tested)  UPPER EXTREMITY MMT:  MMT Right Eval  Left Eval 01/30/2024  Shoulder flexion  3+  Shoulder extension    Shoulder abduction  3+  Shoulder adduction    Shoulder internal rotation  4+  Shoulder external rotation  4  Middle trapezius    Lower trapezius    Elbow flexion  4  Elbow extension  4  Wrist flexion    Wrist extension    Wrist ulnar deviation    Wrist radial deviation    Wrist pronation    Wrist supination    Grip strength (lbs)    (Blank rows = not tested)  SHOULDER SPECIAL TESTS: 01/25/2024 Rotator cuff assessment: Drop arm test: positive  and Empty can test: positive  Cervical Special tests neg  JOINT MOBILITY TESTING:  Test next visit - assumed hypermobile  PALPATION:  01/30/2024 No tenderness at tendons, GH jt  TODAY'S TREATMENT:  Shoulder     Left                                                                                                 02/22/24***     02/13/24 Pulleys 3 min TB rows 3x10 green TB ext 3x10 green TB IR 3x10 green Ball on wall 20x CCW and 20x CW Shrugs 20x  Supine shoulder chest press 3# bar 3x10 Supine shoulder flexion 2# bar 2x10 Manual- Percussive device to UT, MT and rhomboids  DATE: 01/30/24 Initial evaluation of L shoulder completed followed by instruction and trial set of HEP.  PATIENT EDUCATION: 01/30/2024 Education details: HEP, POC Person educated: Patient Education method: Programmer, Multimedia, Demonstration, Verbal cues, and Handouts Education comprehension: verbalized understanding, returned demonstration, and verbal cues required  HOME EXERCISE PROGRAM: Access Code: [facility] URL: [email or web address] Date: 01/30/2024 Prepared by: Burnard Meth  Exercises - Seated Scapular Retraction  - 2 x daily - 7 x weekly - 2 sets - 10 reps - Seated Shoulder Shrugs  - 2 x daily - 7 x weekly - 2 sets - 10 reps  ASSESSMENT:  CLINICAL IMPRESSION: ***Patient needed VC and tactile cues for new exercises .  Demonstrated understanding. OBJECTIVE IMPAIRMENTS: decreased ROM, decreased strength, impaired UE functional use, postural dysfunction, and pain.   ACTIVITY LIMITATIONS: reach over head  PARTICIPATION LIMITATIONS: cleaning  PERSONAL FACTORS: Time since onset of injury/illness/exacerbation and Medical history: vertigo, CTS, thoracic pain, shoulder pain history, dizziness, DDD, hypercholesteremia, lumbar pain, myalgia, nausea, oligomenorrhea, SOB are also affecting patient's functional outcome.   REHAB POTENTIAL: Good  CLINICAL DECISION MAKING: Stable/uncomplicated  EVALUATION  COMPLEXITY: Moderate   GOALS: Goals reviewed with patient? Yes  SHORT TERM GOALS: 02/20/2024  3 weeks***    1.Patient will demonstrate independent use of home exercise program to maintain progress from in clinic treatments. Goal status: New  LONG TERM GOALS:  04/09/2024  10 weeks     1. Patient will demonstrate/report pain at worst less than or equal to 2/10 to facilitate minimal limitation in daily activity secondary to pain symptoms. Goal status: New   2. Patient will demonstrate independent use of home exercise program to facilitate ability to maintain/progress functional gains from skilled physical therapy services. Goal status: New   3. Patient will demonstrate Patient specific functional scale avg > or = 7/10 to indicate reduced disability due to condition.  Goal status: New   4.  Patient will demonstrate Lt UE MMT 4+/5 throughout to facilitate lifting, reaching, carrying at [facility] in daily activity.   Goal status: New   5.  Patient will demonstrate Lt GH joint AROM WFL with minimal to no pain to facilitate usual overhead reaching, self care, dressing at PLOF.    Goal status: New    PLAN:  PT FREQUENCY: 1-2x/week  PT DURATION: 10 weeks  PLANNED INTERVENTIONS: Can include 02853- PT Re-evaluation, 97110-Therapeutic exercises, 97530- Therapeutic activity, [id number]- Neuromuscular re-education, 97535- Self Care, 97140- Manual therapy, [id number]- Gait training, [phone]- Orthotic Fit/training, [phone]- Canalith repositioning, [id number]- Aquatic Therapy, H9716- Electrical stimulation (unattended), [id number] Physical performance testing, 97016- Vasopneumatic device, [id number]-  Ultrasound, 02987- Traction (mechanical), [id number]- Ionotophoresis 4mg /ml Dexamethasone ,  79439 - Needle insertion w/o injection 1 or 2 muscles, 20561 - Needle insertion w/o injection 3 or more muscles.   Patient/Family education, Balance training, Stair training, Taping, Dry Needling, Joint mobilization, Joint manipulation, Spinal  manipulation, Spinal mobilization, Scar mobilization, Vestibular training, Visual/preceptual remediation/compensation, DME instructions, Cryotherapy, and Moist heat.  All performed as medically necessary.  All included unless contraindicated  PLAN FOR NEXT SESSION: ***  Burnard Meth, PT 02/21/2024  2:14 PM    "

## 2024-02-22 ENCOUNTER — Telehealth: Payer: Self-pay

## 2024-02-22 ENCOUNTER — Encounter

## 2024-02-22 NOTE — Telephone Encounter (Signed)
 Called [facility] about missed appointment on 02/22/24.  No future appointments listed so left phone number to call to schedule.

## 2024-02-26 ENCOUNTER — Other Ambulatory Visit (HOSPITAL_COMMUNITY): Payer: Self-pay

## 2024-02-27 ENCOUNTER — Other Ambulatory Visit (HOSPITAL_COMMUNITY): Payer: Self-pay

## 2024-03-08 ENCOUNTER — Other Ambulatory Visit (HOSPITAL_COMMUNITY): Payer: Self-pay

## 2024-03-18 ENCOUNTER — Other Ambulatory Visit (HOSPITAL_COMMUNITY): Payer: Self-pay
